# Patient Record
Sex: Female | Born: 1975
Health system: Southern US, Community
[De-identification: ages and names within clinical notes are randomized; demographics above are authoritative.]

## PROBLEM LIST (undated history)

## (undated) DIAGNOSIS — Z9889 Other specified postprocedural states: Secondary | ICD-10-CM

## (undated) DIAGNOSIS — U071 COVID-19: Secondary | ICD-10-CM

## (undated) DIAGNOSIS — J189 Pneumonia, unspecified organism: Secondary | ICD-10-CM

## (undated) DIAGNOSIS — G4730 Sleep apnea, unspecified: Secondary | ICD-10-CM

## (undated) DIAGNOSIS — J386 Stenosis of larynx: Secondary | ICD-10-CM

## (undated) DIAGNOSIS — F329 Major depressive disorder, single episode, unspecified: Secondary | ICD-10-CM

## (undated) DIAGNOSIS — R112 Nausea with vomiting, unspecified: Secondary | ICD-10-CM

## (undated) DIAGNOSIS — G839 Paralytic syndrome, unspecified: Secondary | ICD-10-CM

## (undated) DIAGNOSIS — D649 Anemia, unspecified: Secondary | ICD-10-CM

## (undated) DIAGNOSIS — J45909 Unspecified asthma, uncomplicated: Secondary | ICD-10-CM

## (undated) DIAGNOSIS — IMO0001 Reserved for inherently not codable concepts without codable children: Secondary | ICD-10-CM

## (undated) DIAGNOSIS — Z87442 Personal history of urinary calculi: Secondary | ICD-10-CM

## (undated) DIAGNOSIS — F32A Depression, unspecified: Secondary | ICD-10-CM

## (undated) DIAGNOSIS — F419 Anxiety disorder, unspecified: Secondary | ICD-10-CM

## (undated) DIAGNOSIS — K219 Gastro-esophageal reflux disease without esophagitis: Secondary | ICD-10-CM

## (undated) HISTORY — PX: WISDOM TOOTH EXTRACTION: SHX21

## (undated) HISTORY — PX: VARICOSE VEIN SURGERY: SHX832

---

## 1997-12-21 ENCOUNTER — Observation Stay (HOSPITAL_COMMUNITY): Admission: AD | Admit: 1997-12-21 | Discharge: 1997-12-22 | Payer: Self-pay | Admitting: *Deleted

## 1998-05-03 ENCOUNTER — Inpatient Hospital Stay (HOSPITAL_COMMUNITY): Admission: AD | Admit: 1998-05-03 | Discharge: 1998-05-05 | Payer: Self-pay | Admitting: *Deleted

## 1998-05-24 ENCOUNTER — Inpatient Hospital Stay (HOSPITAL_COMMUNITY): Admission: AD | Admit: 1998-05-24 | Discharge: 1998-05-24 | Payer: Self-pay | Admitting: *Deleted

## 1998-05-25 ENCOUNTER — Inpatient Hospital Stay (HOSPITAL_COMMUNITY): Admission: AD | Admit: 1998-05-25 | Discharge: 1998-05-27 | Payer: Self-pay | Admitting: Obstetrics and Gynecology

## 1999-08-15 ENCOUNTER — Other Ambulatory Visit: Admission: RE | Admit: 1999-08-15 | Discharge: 1999-08-15 | Payer: Self-pay | Admitting: Obstetrics and Gynecology

## 1999-11-21 ENCOUNTER — Emergency Department (HOSPITAL_COMMUNITY): Admission: EM | Admit: 1999-11-21 | Discharge: 1999-11-21 | Payer: Self-pay | Admitting: Emergency Medicine

## 2000-08-23 ENCOUNTER — Other Ambulatory Visit: Admission: RE | Admit: 2000-08-23 | Discharge: 2000-08-23 | Payer: Self-pay | Admitting: Obstetrics and Gynecology

## 2001-03-08 ENCOUNTER — Inpatient Hospital Stay (HOSPITAL_COMMUNITY): Admission: AD | Admit: 2001-03-08 | Discharge: 2001-03-11 | Payer: Self-pay | Admitting: Obstetrics and Gynecology

## 2001-03-28 ENCOUNTER — Inpatient Hospital Stay (HOSPITAL_COMMUNITY): Admission: AD | Admit: 2001-03-28 | Discharge: 2001-03-30 | Payer: Self-pay | Admitting: Obstetrics and Gynecology

## 2001-05-01 ENCOUNTER — Other Ambulatory Visit: Admission: RE | Admit: 2001-05-01 | Discharge: 2001-05-01 | Payer: Self-pay | Admitting: Obstetrics and Gynecology

## 2002-09-09 ENCOUNTER — Other Ambulatory Visit: Admission: RE | Admit: 2002-09-09 | Discharge: 2002-09-09 | Payer: Self-pay | Admitting: Obstetrics and Gynecology

## 2008-03-25 ENCOUNTER — Ambulatory Visit (HOSPITAL_COMMUNITY): Admission: RE | Admit: 2008-03-25 | Discharge: 2008-03-25 | Payer: Self-pay | Admitting: Family Medicine

## 2010-01-06 ENCOUNTER — Encounter: Admission: RE | Admit: 2010-01-06 | Discharge: 2010-01-06 | Payer: Self-pay | Admitting: Family Medicine

## 2011-03-24 NOTE — H&P (Signed)
[facility]  Patient:    Karen Wolfe, Karen Wolfe                         MRN: [id number] Attending:  Nena Jordan A. Cherly Hensen, M.D.                         History and Physical  2SCHEDULED ADMISSION DATE:     Mar 28, 2001.  CHIEF COMPLAINT:              Induction of labor.  HISTORY OF PRESENT ILLNESS:   This is a 35 year old, gravida 2, para 1-0-0-1, married white female, last menstrual period is June 16, 2000, [facility] of Mar 23, 2001 now at 40-5/[redacted] weeks gestation being admitted for induction of labor secondary to postdates. The patients prenatal course has been complicated by right pyelonephritis with resultant passage of a renal stone and for which the patient has been on suppressive antibiotic therapy. The patient has had irregular contractions. Her membranes are intact. Group B strep culture is positive. Urine culture from the pyelonephritis was positive for group B strep and colonization. Last examination in the office on Mar 27, 2001 revealed the cervix to be 3 cm, about 60%, -1 to 0 station. Prenatal care is at [facility].  LABORATORY DATA:              Blood type is O positive, antibody screen is negative, RPR is nonreactive, rubella is immune, hepatitis B surface antigen is negative, HIV test is nonreactive, GC and Chlamydia cultures were negative. Pap smear was normal. AFP3 test was normal.  Ultrasound first trimester on August 23, 2000 was consistent with the LMP. with a single intrauterine pregnancy at 9.1 weeks.  Group B strep culture is positive on February 20, 2001.  Anatomic fetal survey done on November 16, 2000 was normal at 22.1 weeks.  One-hour GTT test was normal.  Ultrasound on January 31, 2001 for history of oligohydramnios and intrauterine growth restriction previous pregnancy showed adequate interval growth for this baby with normal amniotic fluid index.  PAST MEDICAL HISTORY:  ALLERGIES:                    No known drug  allergies.  MEDICATIONS:                  Prenatal vitamins.  MEDICAL HISTORY:              Negative.  SURGICAL HISTORY:             Negative.  OBSTETRICAL HISTORY:          June of 1999, 6-pound 3-ounce baby at 38 weeks, 12 hour labor. That pregnancy was complicated by oligohydramnios and intrauterine growth restriction.  FAMILY HISTORY:               Mother hyperthyroidism, paternal grandfather hypertension.  SOCIAL HISTORY:               Married, nonsmoker. Works as an Designer, television/film set.  REVIEW OF SYSTEMS:            Negative except for as noted in history of present illness.  PHYSICAL EXAMINATION:  GENERAL:                      Well-developed, well-nourished gravid white female in no acute distress, vital signs afebrile. Blood pressure 116/62.  SKIN:  No lesions.  HEENT:                        Anicteric sclera, pink conjunctiva, oropharynx negative.  HEART:                        Regular rate and rhythm without murmur.  LUNGS:                        Clear to auscultation.  BACK:                         No CVA tenderness.  BREASTS:                      Soft, nontender, no palpable mass.  ABDOMEN:                      Gravid fundal height of 37 cm.  PELVIC:                       As per HPI.  EXTREMITIES:                  Trace edema.  IMPRESSION:                   1. Postdates intrauterine gestation at                                  40-5/7 weeks.                               2. Group B streptococcus culture positive.                               3. Suppressive antibiotics therapy for history                                  of pyelonephritis in the pregnancy.  PLAN:                         1. Admission.                               2. Penicillin prophylaxis.                               3. Pitocin induction.                               4. Amniotomy p.r.n.                               5. Epidural as needed.  The patient does desire  sterilization if cesarean section does occur and the baby is otherwise healthy. She will proceed with her tubal ligation at that time. If she is a vaginal delivery, she prepares to wait to the interval time of  six weeks or greater for the procedure to be done. DD:  03/27/01 TD:  03/27/01 Job: [id number]

## 2011-03-24 NOTE — Discharge Summary (Signed)
[facility]  Patient:    Karen Wolfe, Karen Wolfe                       MRN: [id number] Adm. Date:  [id number] Disc. Date: [id number] Attending:  Maxie Better                           Discharge Summary  ADMISSION DIAGNOSES:          1. Right pyelonephritis.                               2. Positive group B Streptococcus culture.                               3. Intrauterine gestation at 37-5/7 weeks.  DISCHARGE DIAGNOSES:          1. Group B Streptococcus.                               2. Right pyelonephritis.                               3. Probable right kidney stone, passed.                               4. Gastroenteritis.                               5. Intrauterine gestation at 38-2/7 weeks.  HISTORY OF PRESENT ILLNESS:   A 35 year old gravida 2, para 1-0-0-1 female admitted at 37-5/[redacted] weeks gestation for IV antibiotics secondary to right pyelonephritis with urine culture positive for group B Streptococcus.  The patient is also noted to have a positive group B Strep rectovaginal culture. Please see the admission history and physical.  HOSPITAL COURSE:              The patient was admitted.  She was started on cefotetan intravenously.  CBC with differential was obtained.  The CBC revealed a white count of 9.1, hemoglobin 10.7, hematocrit 31.5, platelet count 266,000.  The patient, during her hospitalization, complained of watery diarrhea and nausea and vomiting.  The back pain subsequently resolved. C. difficile toxin was sent, which was subsequently read to be negative.  The patient, on Mar 11, 2001, passed something with her urine. It looked like a stone.  She had a reactive nonstress test with some variables.  No contractions.  By hospital day #4, the patient was feeling much better.  The watery stool had decreased and she was deemed well to be discharged home.  DISPOSITION:                  Home.  CONDITION ON DISCHARGE:        Stable.  DISCHARGE INSTRUCTIONS:       Call for temperature greater than or equal to 100.4, recurrence of back pain, signs or symptoms of labor.  Strain the urine for stones.  DISCHARGE MEDICATIONS:        Keflex 500 mg 1 p.o. q.6h. to complete a total of 14-day course.  FOLLOW-UP:  Follow up in the office on Friday at [facility]. DD:  04/17/01 TD:  04/17/01 Job: [id number]

## 2011-06-01 ENCOUNTER — Other Ambulatory Visit: Payer: Self-pay | Admitting: Family Medicine

## 2011-06-01 ENCOUNTER — Ambulatory Visit
Admission: RE | Admit: 2011-06-01 | Discharge: 2011-06-01 | Disposition: A | Discharge status: Home / Self Care | Payer: 59 | Source: Ambulatory Visit | Attending: Family Medicine | Admitting: Family Medicine

## 2011-06-01 DIAGNOSIS — R0602 Shortness of breath: Secondary | ICD-10-CM

## 2011-06-19 ENCOUNTER — Ambulatory Visit (HOSPITAL_COMMUNITY)
Admission: RE | Admit: 2011-06-19 | Discharge: 2011-06-19 | Disposition: A | Discharge status: Home / Self Care | Payer: 59 | Source: Ambulatory Visit | Attending: Family Medicine | Admitting: Family Medicine

## 2011-06-19 DIAGNOSIS — R0602 Shortness of breath: Secondary | ICD-10-CM | POA: Insufficient documentation

## 2015-08-03 ENCOUNTER — Emergency Department (HOSPITAL_COMMUNITY)
Admission: EM | Admit: 2015-08-03 | Discharge: 2015-08-03 | Disposition: A | Discharge status: Home / Self Care | Payer: Managed Care, Other (non HMO) | Attending: Emergency Medicine | Admitting: Emergency Medicine

## 2015-08-03 ENCOUNTER — Emergency Department (HOSPITAL_COMMUNITY): Payer: Managed Care, Other (non HMO)

## 2015-08-03 ENCOUNTER — Encounter (HOSPITAL_COMMUNITY): Payer: Self-pay | Admitting: Emergency Medicine

## 2015-08-03 DIAGNOSIS — J45901 Unspecified asthma with (acute) exacerbation: Secondary | ICD-10-CM | POA: Diagnosis not present

## 2015-08-03 DIAGNOSIS — R061 Stridor: Secondary | ICD-10-CM | POA: Diagnosis not present

## 2015-08-03 DIAGNOSIS — R000 Tachycardia, unspecified: Secondary | ICD-10-CM | POA: Insufficient documentation

## 2015-08-03 DIAGNOSIS — Z87442 Personal history of urinary calculi: Secondary | ICD-10-CM | POA: Insufficient documentation

## 2015-08-03 DIAGNOSIS — K219 Gastro-esophageal reflux disease without esophagitis: Secondary | ICD-10-CM | POA: Insufficient documentation

## 2015-08-03 DIAGNOSIS — Z79899 Other long term (current) drug therapy: Secondary | ICD-10-CM | POA: Insufficient documentation

## 2015-08-03 DIAGNOSIS — R42 Dizziness and giddiness: Secondary | ICD-10-CM | POA: Insufficient documentation

## 2015-08-03 DIAGNOSIS — R0602 Shortness of breath: Secondary | ICD-10-CM | POA: Diagnosis present

## 2015-08-03 HISTORY — DX: Depression, unspecified: F32.A

## 2015-08-03 HISTORY — DX: Unspecified asthma, uncomplicated: J45.909

## 2015-08-03 HISTORY — DX: Major depressive disorder, single episode, unspecified: F32.9

## 2015-08-03 HISTORY — DX: Gastro-esophageal reflux disease without esophagitis: K21.9

## 2015-08-03 LAB — CBC WITH DIFFERENTIAL/PLATELET
BASOS PCT: 0 %
Basophils Absolute: 0 10*3/uL (ref 0.0–0.1)
EOS PCT: 1 %
Eosinophils Absolute: 0.1 10*3/uL (ref 0.0–0.7)
HEMATOCRIT: 36.2 % (ref 36.0–46.0)
Hemoglobin: 11.7 g/dL — ABNORMAL LOW (ref 12.0–15.0)
LYMPHS PCT: 31 %
Lymphs Abs: 2.6 10*3/uL (ref 0.7–4.0)
MCH: 28.9 pg (ref 26.0–34.0)
MCHC: 32.3 g/dL (ref 30.0–36.0)
MCV: 89.4 fL (ref 78.0–100.0)
MONO ABS: 0.6 10*3/uL (ref 0.1–1.0)
MONOS PCT: 6 %
NEUTROS ABS: 5.2 10*3/uL (ref 1.7–7.7)
Neutrophils Relative %: 62 %
PLATELETS: 310 10*3/uL (ref 150–400)
RBC: 4.05 MIL/uL (ref 3.87–5.11)
RDW: 13.5 % (ref 11.5–15.5)
WBC: 8.6 10*3/uL (ref 4.0–10.5)

## 2015-08-03 LAB — BASIC METABOLIC PANEL
ANION GAP: 8 (ref 5–15)
BUN: 7 mg/dL (ref 6–20)
CALCIUM: 8.9 mg/dL (ref 8.9–10.3)
CO2: 25 mmol/L (ref 22–32)
Chloride: 103 mmol/L (ref 101–111)
Creatinine, Ser: 0.94 mg/dL (ref 0.44–1.00)
GFR calc Af Amer: >60 mL/min (ref >60)
GFR calc non Af Amer: >60 mL/min (ref >60)
GLUCOSE: 147 mg/dL — AB (ref 65–99)
Potassium: 3.4 mmol/L — ABNORMAL LOW (ref 3.5–5.1)
Sodium: 136 mmol/L (ref 135–145)

## 2015-08-03 LAB — PREGNANCY, URINE: Preg Test, Ur: NEGATIVE

## 2015-08-03 MED ORDER — IPRATROPIUM-ALBUTEROL 0.5-2.5 (3) MG/3ML IN SOLN
3.0000 mL | Freq: Once | RESPIRATORY_TRACT | Status: AC
  Administered 2015-08-03: 3 mL via RESPIRATORY_TRACT
  Filled 2015-08-03: qty 3

## 2015-08-03 MED ORDER — DEXAMETHASONE SODIUM PHOSPHATE 10 MG/ML IJ SOLN
10.0000 mg | Freq: Once | INTRAMUSCULAR | Status: AC
  Administered 2015-08-03: 10 mg via INTRAVENOUS
  Filled 2015-08-03: qty 1

## 2015-08-03 MED ORDER — ALBUTEROL SULFATE (2.5 MG/3ML) 0.083% IN NEBU
INHALATION_SOLUTION | RESPIRATORY_TRACT | Status: AC
  Filled 2015-08-03: qty 6

## 2015-08-03 NOTE — ED Provider Notes (Signed)
CSN: [id number]     Arrival date & time 08/03/15  1312 History   First MD Initiated Contact with Patient 08/03/15 1339     Chief Complaint  Patient presents with  . Shortness of Breath     (Consider location/radiation/quality/duration/timing/severity/associated sxs/prior Treatment) HPI Comments: 39 year old female with a history of asthma, depression, GERD who presents with shortness of breath. The patient states that for the past several days she has had increasing shortness of breath. The shortness of breath feels like it's coming from her upper airway and she feels like she is breathing through a straw. Today, she ate a [place] sandwich and after lunch began feeling funny, stating that her upper airway stridor became worse and she felt dizzy. She has been evaluated by ENT in the past and was told that she has a narrowing of her upper airway that may require dilation in the future, liver she has been holding off for now. She has follow-up scheduled in a few weeks. She denies any associated chest pain, abdominal pain, vomiting, diarrhea, or cold symptoms. She has an occasional cough. No personal or family history of blood clots. No recent travel or OCP use. No leg swelling or pain. No family history of early heart disease.  Patient is a 39 y.o. female presenting with shortness of breath. The history is provided by the patient.  Shortness of Breath   Past Medical History  Diagnosis Date  . Asthma   . Kidney stone   . Kidney stone   . Depression   . GERD (gastroesophageal reflux disease)    Past Surgical History  Procedure Laterality Date  . Wisdom tooth extraction     History reviewed. No pertinent family history. Social History  Substance Use Topics  . Smoking status: Never Smoker   . Smokeless tobacco: None  . Alcohol Use: No   OB History    No data available     Review of Systems  Respiratory: Positive for shortness of breath.     10 Systems reviewed and are negative for  acute change except as noted in the HPI.   Allergies  Mold extract  Home Medications   Prior to Admission medications   Medication Sig Start Date End Date Taking? Authorizing Provider  buPROPion (WELLBUTRIN XL) 150 MG 24 hr tablet Take 150 mg by mouth daily. 07/13/15  Yes Historical Provider, MD  cetirizine (ZYRTEC) 10 MG tablet Take 10 mg by mouth daily as needed for allergies.   Yes Historical Provider, MD  loratadine (CLARITIN) 10 MG tablet Take 10 mg by mouth daily.   Yes Historical Provider, MD  omeprazole (PRILOSEC) 40 MG capsule Take 40 mg by mouth daily. 07/13/15  Yes Historical Provider, MD  sertraline (ZOLOFT) 50 MG tablet Take 50 mg by mouth daily. 07/13/15  Yes Historical Provider, MD  XOPENEX HFA 45 MCG/ACT inhaler Inhale 1 puff into the lungs every 4 (four) hours as needed for wheezing or shortness of breath.  06/01/15  Yes Historical Provider, MD   BP 122/69 mmHg  Pulse 113  Temp(Src) 98.8 F (37.1 C) (Oral)  Resp 22  SpO2 99%  LMP 07/31/2015 Physical Exam  Constitutional: She is oriented to person, place, and time. She appears well-developed and well-nourished.  Uncomfortable but NAD  HENT:  Head: Normocephalic and atraumatic.  Moist mucous membranes  Eyes: Conjunctivae are normal. Pupils are equal, round, and reactive to light.  Neck: Neck supple. No tracheal deviation present.  Soft inspiratory stridor  Cardiovascular: Regular rhythm  and normal heart sounds.   No murmur heard. tachycardic  Pulmonary/Chest:  Mildly increased WOB during inspiration w/ inspiratory stridor noted; faint wheezes in bases b/l  Abdominal: Soft. Bowel sounds are normal. She exhibits no distension. There is no tenderness.  Musculoskeletal: She exhibits no edema.  Neurological: She is alert and oriented to person, place, and time.  Fluent speech  Skin: Skin is warm and dry.  Psychiatric: She has a normal mood and affect. Judgment normal.  Nursing note and vitals reviewed.   ED Course   Procedures (including critical care time) Labs Review Labs Reviewed  BASIC METABOLIC PANEL - Abnormal; Notable for the following:    Potassium 3.4 (*)    Glucose, Bld 147 (*)    All other components within normal limits  CBC WITH DIFFERENTIAL/PLATELET - Abnormal; Notable for the following:    Hemoglobin 11.7 (*)    All other components within normal limits  PREGNANCY, URINE    Imaging Review Dg Chest 2 View  08/03/2015   CLINICAL DATA:  Progressive shortness of breath over past several days  EXAM: CHEST  2 VIEW  COMPARISON:  June 01, 2011  FINDINGS: Lungs are clear. The heart size and pulmonary vascularity are normal. No adenopathy. No bone lesions.  IMPRESSION: No edema or consolidation.   Electronically Signed   By: Bretta Bang III M.D.   On: 08/03/2015 15:03      EKG Interpretation   Date/Time:  Tuesday August 03 2015 13:49:50 EDT Ventricular Rate:  103 PR Interval:  150 QRS Duration: 85 QT Interval:  341 QTC Calculation: 446 R Axis:   56 Text Interpretation:  Sinus tachycardia Confirmed by LITTLE MD, RACHEL  [phone]) on 08/03/2015 2:06:35 PM     Medications  albuterol (PROVENTIL) (2.5 MG/3ML) 0.083% nebulizer solution (  Duplicate 08/03/15 1442)  dexamethasone (DECADRON) injection 10 mg (10 mg Intravenous Given 08/03/15 1407)  ipratropium-albuterol (DUONEB) 0.5-2.5 (3) MG/3ML nebulizer solution 3 mL (3 mLs Nebulization Given 08/03/15 1407)    MDM   Final diagnoses:  Inspiratory stridor   39 year old female with a history of stridor thought to be related to silent reflux who presents with several days of worsening shortness of breath and upper airway stridor. On arrival, the patient was uncomfortable with mildly increased work of breathing during inspiration and soft inspiratory stridor noted on exam. Faint wheezes bilaterally. She was mildly tachycardic but O2 sats were normal on room air. EKG showed sinus tachycardia without ischemic changes. Obtained chest  x-ray. Gave the patient a DuoNeb as well as 10 mg IV Decadron.  Patient has no risk factors for PE and describes her SOB as an upper airway tightness sensation, which is more c/w upper airway strenosis rather than PE. No risk factors for early heart disease. On reevaluation after receiving DuoNeb neb, the patient was breathing comfortably with O2 sat 99-100%. She remains tachycardic but states that her breathing treatment just finished 30-45 minutes ago and she feels that it is related to the albuterol. She states that all of her symptoms feel like they involve her upper airway and she feels comfortable without any further workup including PE workup at this time. I spoke with her ENT, Dr. Jenne Pane, who has recommended Medrol Dosepak and follow-up in the clinic tomorrow. I have relayed this information the patient and provided her with a prescription. Extensively reviewed return precautions including worsening symptoms and the patient voiced understanding. Patient in agreement with plan. Patient discharged in satisfactory condition.    Fleet Contras  Pecolia Ades, MD 08/03/15 [phone]

## 2015-08-03 NOTE — ED Notes (Signed)
Pt reports increasing SOB over the last few days, today at 1145 after eating [place] sandwich began to have increased SOB. Pt with upper airway stridors sounds. Pt seen by ENT in the past for bronch and told she has narrowing of airways that may require stretching. Pt awake, alert, VSS.

## 2015-08-03 NOTE — ED Notes (Signed)
Patient transported to X-ray 

## 2015-08-03 NOTE — ED Notes (Signed)
Dr. Little at bedside.  

## 2015-08-03 NOTE — Discharge Instructions (Signed)
Stridor °Stridor is an abnormal, usually high-pitched sound made while breathing. It is the result of an airway that is partly blocked. Stridor occurs more often in children than in adults because children have smaller airways. Many different things can cause stridor. It might be an infection, a tumor, something stuck in the breathing passage, or part of a developmental problem of the airways. It is important that the symptoms be checked out promptly, especially in young children. °CAUSES  °Stridor can develop from an acute problem and come on quickly in children. This is often because: °· Something gets stuck in the child's throat, nose or airways. The stuck item could be anything, but might be a piece of food or a coin. °· The child develops croup. This is a breathing problem with a cough that sounds like a dog's bark. It results from swelling around the vocal cords. Croup is usually caused by a virus. °· The child develops swollen tonsils or adenoids (tonsillitis). °· The child develops a swollen area filled with pus on the tonsils (abscess). °· The child has an allergic reaction. This could be to something that was breathed in, swallowed or injected. °· The child had their airway evaluated by instruments or had a tube in their airway. °· The child develops epiglottitis. This is an emergency condition. This occurs when the epiglottis (a small piece of tissue that covers the windpipe when you swallow and keeps food from going into the lungs) becomes inflamed (the body's way or reacting to injury or infection). Different things can cause the inflammation, including: °¨ Infection (this is the usual cause). °¨ Injury (swallowing chemicals, for example). °Stridor also can develop from a longtime (chronic) problem. Possibilities include: °· Laryngomalacia. This occurs when floppy tissue above the vocal cords collapses into the airway when the child breathes in. °· Subglottic stenosis. This is a narrowing of the airway  just below the vocal cords. °· Tracheomalacia. This occurs when the cartilage that keeps the airway open is weak. The cartilage is weak and floppy causing the airway to collapse in. This can also occur when there is something compressing the airway or something damages the cartilage causing it to become weak. °· Vocal cord paralysis. This may result from trauma or brain abnormality. For instance, the vocal cords might have been injured during earlier surgery. °· An injury to the voice box. °· A tumor. °DIAGNOSIS  °In an emergency:  °· If something is stuck in a child's airway, the Heimlich maneuver might be used to force the item out of the windpipe. °· If something is blocking the airway, an artificial airway may need to be placed for relief of the obstruction. °· An operation may needed. °If the child is not in immediate danger: °· The child will be given a thorough exam. Usually, the child's temperature, pulse, breathing rate and oxygen levels will be checked. The healthcare provider will listen to the child's lungs through a stethoscope. The child's throat will be checked. °· The healthcare provider will check for swelling in the child's neck or face area. °· The healthcare provider will ask about the child's medical history. This will include questions about the abnormal breathing sound. They may ask when the abnormal breathing started and what did it sound like. °· The healthcare provider may also order some tests. These could include: °¨ Blood tests. The blood can give clues to the child's overall health. It also can show signs of infection. And, a blood test can show how   much oxygen the child is getting. °¨ Pulse oximetry . A device is put on the child's fingertip to measure oxygen levels in the blood. °¨ Bronchoscopy . A flexible tube with a camera and a light is used to evaluate the airways. The child probably will be given medication to numb pain and help the child relax for the test. If given general  anesthesia, the child will be asleep for the procedure. A local anesthetic would numb the area of the body, but the child would be awake. A sedative will help the child relax. °¨ CT (computed tomography) scan. This scan provides a detailed picture inside the body. °¨ Laryngoscopy . A small, lighted tube is used to check the area around voice box. This is usually done without sedation while the patient is awake °¨ X-ray of the chest or neck. This can sometimes locate something stuck in the airway or show swelling in the airway. °TREATMENT  °In the short term: °· If something is stuck in a child's airway, the Heimlich maneuver might be used to force the item out of the windpipe. °· If nothing is stuck but the child has serious trouble breathing, an artificial airway or an operation to create an airway may be needed °In the longer term, stridor is treated by treating whatever is causing it:  °· If a growth or tumor is causing the obstruction, surgery may be recommended to remove it. °· Antibiotics may be prescribed to treat an infection. °HOME CARE INSTRUCTIONS  °What care the child will need at home will depend on what caused the stridor and how it was treated. In general: °· Ask the child's healthcare provider if there is anything the child should or should not do while recovering. °· Make sure the child takes any medications that were prescribed. Follow the directions carefully. The child should take all of the medicine, unless the healthcare provider has given different instructions. °· Encourage the child to eat slowly. Careful eating can help prevent food from being inhaled accidentally. °SEEK MEDICAL CARE IF:  °· The child develops a fever above 100.5° F (38.1° C). °SEEK IMMEDIATE MEDICAL CARE IF:  °· The child has trouble breathing again. °· Other symptoms return. °· The child develops a fever above 102.0° F (38.9° C). °Document Released: 08/20/2009 Document Revised: 01/15/2012 Document Reviewed:  08/20/2009 °Exit[employer]® Patient Information ©2015 [employer]. This information is not intended to replace advice given to you by your health care provider. Make sure you discuss any questions you have with your health care provider. ° °

## 2015-08-06 ENCOUNTER — Encounter (HOSPITAL_COMMUNITY): Payer: Self-pay | Admitting: *Deleted

## 2015-08-06 ENCOUNTER — Other Ambulatory Visit (HOSPITAL_COMMUNITY): Payer: Self-pay | Admitting: Otolaryngology

## 2015-08-09 ENCOUNTER — Encounter (HOSPITAL_COMMUNITY): Payer: Self-pay

## 2015-08-09 ENCOUNTER — Ambulatory Visit (HOSPITAL_COMMUNITY): Payer: Managed Care, Other (non HMO) | Admitting: Certified Registered"

## 2015-08-09 ENCOUNTER — Encounter (HOSPITAL_COMMUNITY)
Admission: RE | Disposition: A | Discharge status: Home / Self Care | Payer: Self-pay | Source: Ambulatory Visit | Attending: Otolaryngology

## 2015-08-09 ENCOUNTER — Ambulatory Visit (HOSPITAL_COMMUNITY)
Admission: RE | Admit: 2015-08-09 | Discharge: 2015-08-09 | Disposition: A | Discharge status: Home / Self Care | Payer: Managed Care, Other (non HMO) | Source: Ambulatory Visit | Attending: Otolaryngology | Admitting: Otolaryngology

## 2015-08-09 DIAGNOSIS — F329 Major depressive disorder, single episode, unspecified: Secondary | ICD-10-CM | POA: Diagnosis not present

## 2015-08-09 DIAGNOSIS — F419 Anxiety disorder, unspecified: Secondary | ICD-10-CM | POA: Diagnosis not present

## 2015-08-09 DIAGNOSIS — E669 Obesity, unspecified: Secondary | ICD-10-CM | POA: Diagnosis not present

## 2015-08-09 DIAGNOSIS — Z79899 Other long term (current) drug therapy: Secondary | ICD-10-CM | POA: Diagnosis not present

## 2015-08-09 DIAGNOSIS — K219 Gastro-esophageal reflux disease without esophagitis: Secondary | ICD-10-CM | POA: Diagnosis not present

## 2015-08-09 DIAGNOSIS — Z6831 Body mass index (BMI) 31.0-31.9, adult: Secondary | ICD-10-CM | POA: Insufficient documentation

## 2015-08-09 DIAGNOSIS — J386 Stenosis of larynx: Secondary | ICD-10-CM | POA: Diagnosis not present

## 2015-08-09 DIAGNOSIS — J45909 Unspecified asthma, uncomplicated: Secondary | ICD-10-CM | POA: Diagnosis not present

## 2015-08-09 HISTORY — DX: Anxiety disorder, unspecified: F41.9

## 2015-08-09 HISTORY — PX: MICROLARYNGOSCOPY WITH DILATION: SHX5971

## 2015-08-09 HISTORY — DX: Reserved for inherently not codable concepts without codable children: IMO0001

## 2015-08-09 HISTORY — DX: Personal history of urinary calculi: Z87.442

## 2015-08-09 SURGERY — MICROLARYNGOSCOPY WITH DILATION
Anesthesia: General | Site: Mouth

## 2015-08-09 MED ORDER — LACTATED RINGERS IV SOLN
INTRAVENOUS | Status: DC
  Administered 2015-08-09: 10:00:00 via INTRAVENOUS

## 2015-08-09 MED ORDER — SUCCINYLCHOLINE CHLORIDE 20 MG/ML IJ SOLN
INTRAMUSCULAR | Status: DC | PRN
  Administered 2015-08-09: 100 mg via INTRAVENOUS

## 2015-08-09 MED ORDER — FENTANYL CITRATE (PF) 100 MCG/2ML IJ SOLN
INTRAMUSCULAR | Status: AC
  Filled 2015-08-09: qty 2

## 2015-08-09 MED ORDER — EPINEPHRINE HCL (NASAL) 0.1 % NA SOLN
NASAL | Status: AC
  Filled 2015-08-09: qty 30

## 2015-08-09 MED ORDER — PROPOFOL 500 MG/50ML IV EMUL
INTRAVENOUS | Status: DC | PRN
  Administered 2015-08-09: 150 ug/kg/min via INTRAVENOUS

## 2015-08-09 MED ORDER — ONDANSETRON HCL 4 MG/2ML IJ SOLN
INTRAMUSCULAR | Status: AC
  Filled 2015-08-09: qty 2

## 2015-08-09 MED ORDER — MIDAZOLAM HCL 2 MG/2ML IJ SOLN
INTRAMUSCULAR | Status: AC
  Filled 2015-08-09: qty 4

## 2015-08-09 MED ORDER — PHENYLEPHRINE HCL 10 MG/ML IJ SOLN
INTRAMUSCULAR | Status: DC | PRN
  Administered 2015-08-09: 80 ug via INTRAVENOUS

## 2015-08-09 MED ORDER — 0.9 % SODIUM CHLORIDE (POUR BTL) OPTIME
TOPICAL | Status: DC | PRN
  Administered 2015-08-09: 1000 mL

## 2015-08-09 MED ORDER — PROMETHAZINE HCL 25 MG/ML IJ SOLN: 6.2500 mg | INTRAMUSCULAR | Status: DC | PRN

## 2015-08-09 MED ORDER — MIDAZOLAM HCL 5 MG/5ML IJ SOLN
INTRAMUSCULAR | Status: DC | PRN
  Administered 2015-08-09: 2 mg via INTRAVENOUS

## 2015-08-09 MED ORDER — OXYCODONE HCL 5 MG/5ML PO SOLN
ORAL | Status: AC
  Filled 2015-08-09: qty 5

## 2015-08-09 MED ORDER — PROPOFOL 10 MG/ML IV BOLUS
INTRAVENOUS | Status: AC
  Filled 2015-08-09: qty 20

## 2015-08-09 MED ORDER — SODIUM CHLORIDE 0.9 % IV SOLN
0.0500 ug/kg/min | INTRAVENOUS | Status: DC
  Filled 2015-08-09: qty 5000

## 2015-08-09 MED ORDER — FENTANYL CITRATE (PF) 250 MCG/5ML IJ SOLN
INTRAMUSCULAR | Status: AC
  Filled 2015-08-09: qty 5

## 2015-08-09 MED ORDER — SUCCINYLCHOLINE CHLORIDE 20 MG/ML IJ SOLN
INTRAMUSCULAR | Status: AC
  Filled 2015-08-09: qty 1

## 2015-08-09 MED ORDER — PROPOFOL 10 MG/ML IV BOLUS
INTRAVENOUS | Status: DC | PRN
  Administered 2015-08-09: 150 mg via INTRAVENOUS

## 2015-08-09 MED ORDER — OXYCODONE HCL 20 MG/ML PO CONC: 5.0000 mg | Freq: Once | ORAL | Status: DC

## 2015-08-09 MED ORDER — LACTATED RINGERS IV SOLN
INTRAVENOUS | Status: DC | PRN
  Administered 2015-08-09: 11:00:00 via INTRAVENOUS

## 2015-08-09 MED ORDER — REMIFENTANIL HCL 1 MG IV SOLR
INTRAVENOUS | Status: DC | PRN
  Administered 2015-08-09: .15 ug/kg/min via INTRAVENOUS

## 2015-08-09 MED ORDER — ROCURONIUM BROMIDE 50 MG/5ML IV SOLN
INTRAVENOUS | Status: AC
  Filled 2015-08-09: qty 1

## 2015-08-09 MED ORDER — NEOSTIGMINE METHYLSULFATE 10 MG/10ML IV SOLN
INTRAVENOUS | Status: AC
  Filled 2015-08-09: qty 1

## 2015-08-09 MED ORDER — FENTANYL CITRATE (PF) 100 MCG/2ML IJ SOLN
25.0000 ug | INTRAMUSCULAR | Status: DC | PRN
  Administered 2015-08-09: 50 ug via INTRAVENOUS

## 2015-08-09 MED ORDER — ALBUTEROL SULFATE HFA 108 (90 BASE) MCG/ACT IN AERS
INHALATION_SPRAY | RESPIRATORY_TRACT | Status: AC
  Filled 2015-08-09: qty 6.7

## 2015-08-09 MED ORDER — LIDOCAINE HCL (CARDIAC) 20 MG/ML IV SOLN
INTRAVENOUS | Status: AC
  Filled 2015-08-09: qty 5

## 2015-08-09 MED ORDER — SODIUM CHLORIDE 0.9 % IV SOLN
0.0125 ug/kg/min | INTRAVENOUS | Status: DC
  Filled 2015-08-09: qty 2000

## 2015-08-09 MED ORDER — SUGAMMADEX SODIUM 200 MG/2ML IV SOLN
INTRAVENOUS | Status: AC
  Filled 2015-08-09: qty 2

## 2015-08-09 MED ORDER — EPINEPHRINE HCL 1 MG/ML IJ SOLN
INTRAMUSCULAR | Status: DC | PRN
Start: 2015-08-09 — End: 2015-08-09
  Administered 2015-08-09: 30 mg via ENDOTRACHEOPULMONARY

## 2015-08-09 MED ORDER — OXYCODONE HCL 5 MG/5ML PO SOLN
5.0000 mg | Freq: Once | ORAL | Status: AC
  Administered 2015-08-09: 5 mg via ORAL

## 2015-08-09 SURGICAL SUPPLY — 36 items
BALLN PULM 15 16.5 18X75 (BALLOONS) ×2
BALLOON PULM 15 16.5 18X75 (BALLOONS) IMPLANT
BANDAGE EYE OVAL (MISCELLANEOUS) ×4 IMPLANT
BLADE SURG 15 STRL LF DISP TIS (BLADE) IMPLANT
BLADE SURG 15 STRL SS (BLADE)
CANISTER SUCTION 2500CC (MISCELLANEOUS) ×2 IMPLANT
CONT SPEC 4OZ CLIKSEAL STRL BL (MISCELLANEOUS) IMPLANT
COVER MAYO STAND STRL (DRAPES) ×1 IMPLANT
COVER TABLE BACK 60X90 (DRAPES) ×2 IMPLANT
CRADLE DONUT ADULT HEAD (MISCELLANEOUS) IMPLANT
DRAPE PROXIMA HALF (DRAPES) ×2 IMPLANT
GAUZE SPONGE 4X4 12PLY STRL (GAUZE/BANDAGES/DRESSINGS) ×2 IMPLANT
GAUZE SPONGE 4X4 16PLY XRAY LF (GAUZE/BANDAGES/DRESSINGS) ×1 IMPLANT
GLOVE BIO SURGEON STRL SZ7.5 (GLOVE) ×2 IMPLANT
GLOVE BIOGEL PI IND STRL 7.0 (GLOVE) IMPLANT
GLOVE BIOGEL PI INDICATOR 7.0 (GLOVE) ×2
GLOVE ECLIPSE 6.5 STRL STRAW (GLOVE) ×1 IMPLANT
GLOVE SURG SS PI 7.0 STRL IVOR (GLOVE) ×1 IMPLANT
GOWN STRL REUS W/ TWL LRG LVL3 (GOWN DISPOSABLE) IMPLANT
GOWN STRL REUS W/TWL LRG LVL3 (GOWN DISPOSABLE)
KIT BASIN OR (CUSTOM PROCEDURE TRAY) ×2 IMPLANT
KIT ROOM TURNOVER OR (KITS) ×2 IMPLANT
NDL HYPO 25GX1X1/2 BEV (NEEDLE) IMPLANT
NEEDLE HYPO 25GX1X1/2 BEV (NEEDLE) IMPLANT
NS IRRIG 1000ML POUR BTL (IV SOLUTION) ×2 IMPLANT
PAD ARMBOARD 7.5X6 YLW CONV (MISCELLANEOUS) ×4 IMPLANT
PATTIES SURGICAL .5 X3 (DISPOSABLE) ×2 IMPLANT
SOLUTION ANTI FOG 6CC (MISCELLANEOUS) IMPLANT
SURGILUBE 2OZ TUBE FLIPTOP (MISCELLANEOUS) IMPLANT
SYR INFLATE BILIARY GAUGE (MISCELLANEOUS) ×1 IMPLANT
SYSTEM BALLN DILATION 12X40 (BALLOONS) IMPLANT
SYSTEM BALLN DILATION 14X40 (BALLOONS) IMPLANT
SYSTEM BALLN DILATION 16X40 (BALLOONS) IMPLANT
TOWEL OR 17X24 6PK STRL BLUE (TOWEL DISPOSABLE) ×4 IMPLANT
TUBE CONNECTING 12X1/4 (SUCTIONS) ×2 IMPLANT
WATER STERILE IRR 1000ML POUR (IV SOLUTION) ×2 IMPLANT

## 2015-08-09 NOTE — Anesthesia Postprocedure Evaluation (Signed)
  Anesthesia Post-op Note  Patient: Karen Wolfe  Procedure(s) Performed: Procedure(s) (LRB): MICROLARYNGOSCOPY WITH DILATION (N/A)  Patient Location: PACU  Anesthesia Type: General  Level of Consciousness: awake and alert   Airway and Oxygen Therapy: Patient Spontanous Breathing  Post-op Pain: mild  Post-op Assessment: Post-op Vital signs reviewed, Patient's Cardiovascular Status Stable, Respiratory Function Stable, Patent Airway and No signs of Nausea or vomiting  Last Vitals:  Filed Vitals:   08/09/15 1216  BP: 122/96  Pulse: 89  Temp:   Resp: 22    Post-op Vital Signs: stable   Complications: No apparent anesthesia complications

## 2015-08-09 NOTE — Brief Op Note (Signed)
08/09/2015  11:54 AM  PATIENT:  Karen Wolfe  39 y.o. female  PRE-OPERATIVE DIAGNOSIS:  SUBGLOTTIC STENOSIS  POST-OPERATIVE DIAGNOSIS:  SUBGLOTTIC STENOSIS  PROCEDURE:  Procedure(s) with comments: MICROLARYNGOSCOPY WITH DILATION (N/A) - MICRO DIRECT LARYNGOSCOPY WITH CO2 LASER DILATION/JET VENTILATION  SURGEON:  Surgeon(s) and Role:    * Christia Reading, MD - Primary  PHYSICIAN ASSISTANT:   ASSISTANTS: none   ANESTHESIA:   general  EBL:     BLOOD ADMINISTERED:none  DRAINS: none   LOCAL MEDICATIONS USED:  NONE  SPECIMEN:  No Specimen  DISPOSITION OF SPECIMEN:  N/A  COUNTS:  YES  TOURNIQUET:  * No tourniquets in log *  DICTATION: .Other Dictation: Dictation Number [id number]  PLAN OF CARE: Discharge to home after PACU  PATIENT DISPOSITION:  PACU - hemodynamically stable.   Delay start of Pharmacological VTE agent (>24hrs) due to surgical blood loss or risk of bleeding: no

## 2015-08-09 NOTE — H&P (Signed)
Karen Wolfe is an 39 y.o. female.   Chief Complaint: subglottic stenosis HPI: 39 year old with about six months of a feeling of difficulty breathing, particularly with exertion.  She has soft inspiratory stridor.  Fiberoptic exam revealed subglottic stenosis in July.  She has been treated with PPI since then but symptoms have worsened gradually.  She feels limited in her activity and uncomfortable.  Past Medical History  Diagnosis Date  . Asthma   . Depression   . History of kidney stones   . Shortness of breath dyspnea     with exertion- upper airway stricture  . Anxiety   . GERD (gastroesophageal reflux disease)     ? silent reflux  . Vaginal delivery     epidural x2     Past Surgical History  Procedure Laterality Date  . Wisdom tooth extraction      History reviewed. No pertinent family history. Social History:  reports that she has never smoked. She does not have any smokeless tobacco history on file. She reports that she does not drink alcohol or use illicit drugs.  Allergies:  Allergies  Allergen Reactions  . Other Shortness Of Breath and Nausea Only    Difficukty Breathing  . Mold Extract [Trichophyton]   . Shellfish Allergy Nausea And Vomiting    Medications Prior to Admission  Medication Sig Dispense Refill  . buPROPion (WELLBUTRIN XL) 150 MG 24 hr tablet Take 150 mg by mouth daily.    . cetirizine (ZYRTEC) 10 MG tablet Take 10 mg by mouth daily as needed for allergies.    Marland Kitchen loratadine (CLARITIN) 10 MG tablet Take 10 mg by mouth daily.    Marland Kitchen omeprazole (PRILOSEC) 40 MG capsule Take 40 mg by mouth daily.    . sertraline (ZOLOFT) 50 MG tablet Take 50 mg by mouth daily.    Pauline Aus HFA 45 MCG/ACT inhaler Inhale 1 puff into the lungs every 4 (four) hours as needed for wheezing or shortness of breath.   3    No results found for this or any previous visit (from the past 48 hour(s)). No results found.  Review of Systems  All other systems reviewed and are  negative.   Blood pressure 165/78, pulse 88, temperature 97.6 F (36.4 C), temperature source Oral, resp. rate 18, height  (1.6 m), weight 81.647 kg (180 lb), last menstrual period 07/31/2015, SpO2 100 %. Physical Exam  Constitutional: She is oriented to person, place, and time. She appears well-developed and well-nourished. No distress.  HENT:  Head: Normocephalic and atraumatic.  Right Ear: External ear normal.  Left Ear: External ear normal.  Nose: Nose normal.  Mouth/Throat: Oropharynx is clear and moist.  Soft inspiratory stridor with deep inspiration.  Eyes: Conjunctivae and EOM are normal. Pupils are equal, round, and reactive to light.  Neck: Normal range of motion. Neck supple.  Cardiovascular: Normal rate.   Respiratory: Effort normal.  Musculoskeletal: Normal range of motion.  Neurological: She is alert and oriented to person, place, and time. No cranial nerve deficit.  Skin: Skin is warm and dry.  Psychiatric: She has a normal mood and affect. Her behavior is normal. Judgment and thought content normal.     Assessment/Plan Subglottic stenosis To OR for SMDL with CO2 laser dilation.  Neelam Tiggs 08/09/2015, 10:59 AM

## 2015-08-09 NOTE — Transfer of Care (Signed)
Immediate Anesthesia Transfer of Care Note  Patient: Karen Wolfe  Procedure(s) Performed: Procedure(s) with comments: MICROLARYNGOSCOPY WITH DILATION (N/A) - MICRO DIRECT LARYNGOSCOPY WITH CO2 LASER DILATION/JET VENTILATION  Patient Location: PACU  Anesthesia Type:TIVA   Level of Consciousness: awake, alert  and oriented  Airway & Oxygen Therapy: Patient Spontanous Breathing and Patient connected to face mask oxygen  Post-op Assessment: Report given to RN, Post -op Vital signs reviewed and stable and Patient moving all extremities  Post vital signs: Reviewed and stable  Last Vitals:  Filed Vitals:   08/09/15 1211  BP:   Pulse:   Temp: 36.7 C  Resp:     Complications: No apparent anesthesia complications

## 2015-08-09 NOTE — Anesthesia Preprocedure Evaluation (Addendum)
Anesthesia Evaluation  Patient identified by MRN, date of birth, ID band Patient awake    Reviewed: Allergy & Precautions, H&P , NPO status , Patient's Chart, lab work & pertinent test results  History of Anesthesia Complications Negative for: history of anesthetic complications  Airway Mallampati: I  TM Distance: >3 FB Neck ROM: full    Dental no notable dental hx.    Pulmonary shortness of breath, asthma ,    Pulmonary exam normal breath sounds clear to auscultation       Cardiovascular negative cardio ROS Normal cardiovascular exam Rhythm:regular Rate:Normal     Neuro/Psych Anxiety Depression negative neurological ROS     GI/Hepatic Neg liver ROS, GERD  Medicated,  Endo/Other  negative endocrine ROS  Renal/GU negative Renal ROS     Musculoskeletal   Abdominal (+) + obese,   Peds  Hematology negative hematology ROS (+)   Anesthesia Other Findings History of Tracheal Stenosis  Reproductive/Obstetrics negative OB ROS                            Anesthesia Physical Anesthesia Plan  ASA: II  Anesthesia Plan: General   Post-op Pain Management:    Induction: Intravenous  Airway Management Planned: Natural Airway  Additional Equipment:   Intra-op Plan:   Post-operative Plan: Extubation in OR  Informed Consent: I have reviewed the patients History and Physical, chart, labs and discussed the procedure including the risks, benefits and alternatives for the proposed anesthesia with the patient or authorized representative who has indicated his/her understanding and acceptance.   Dental Advisory Given  Plan Discussed with: Anesthesiologist, CRNA and Surgeon  Anesthesia Plan Comments: (Will discuss airway plan with ENT Dr. Jenne Pane  Likely propofol infusion with jet ventilation)      Anesthesia Quick Evaluation

## 2015-08-09 NOTE — Op Note (Signed)
NAMESHYLOH, DEROSA NO.:  [id number]  MEDICAL RECORD NO.:  [id number]  LOCATION:  [facility]                         FACILITY:  [facility]  PHYSICIAN:  Antony Contras, MD     DATE OF BIRTH:  [birth year]  DATE OF PROCEDURE:  08/09/2015 DATE OF DISCHARGE:  08/09/2015                              OPERATIVE REPORT   PREOPERATIVE DIAGNOSIS:  Subglottic stenosis.  POSTOPERATIVE DIAGNOSIS:  Subglottic stenosis.  PROCEDURE:  Suspended Micro-Direct laryngoscopy with CO2 laser dilation.  SURGEON:  Antony Contras, MD  ANESTHESIA:  General jet ventilation anesthesia.  COMPLICATIONS:  None.  INDICATION:  The patient is a 39 year old female who has noticed difficulty breathing for the past 6 months, it has worsened.  She was found to have subglottic stenosis by fiberoptic exam and has been under treatment with proton pump inhibitor, but symptoms have worsened.  Thus, she presents to the operating room for surgical management.  FINDINGS:  She was found to have a circumferential stenosis in the subglottic region, approximately 50%.  DESCRIPTION OF PROCEDURE:  The patient was identified in the holding room, informed consent having been obtained including discussion of risks, benefits, alternatives, the patient was brought to the operative suite and put on the table in supine position.  Anesthesia was induced, and the patient was maintained via mask ventilation.  The eyes were taped and closed and bed was turned to 90 degrees from anesthesia.  A tooth guard was placed over the upper teeth and damp eye pads were taped over the eyes.  A Storz laryngoscope was then placed in the glottic position to view the subglottis and was then suspended to the Mayo stand using a Lewy arm.  Jet Venturi ventilation was then initiated.  A damp towel was placed over the face.  A 0-degree telescope was then used to make a preoperative photograph.  Epinephrine-soaked pledgets were held against the  stenotic area for a brief time.  The CO2 laser was then used on a setting of 8 watts to make radial cuts at 7 o'clock, 5 o'clock, and 12 o'clock.  Epinephrine pledgets were again placed against the areas to help control bleeding.  After this, the largest tracheal balloon was then placed into the subglottic area and inflated to 7 atmospheres for 60 seconds.  The balloon was then removed, and the patient jet ventilated.  The balloon was again dilated in the same place for 60 seconds and then removed again.  The patient returned to jet ventilation.  Photographs were then made with a 0-degree telescope.  The laryngoscope was then taken out of suspension and removed from the patient's mouth while suctioning the airway and after for spraying the larynx with topical lidocaine using an LTA. The tooth guard was removed, and the patient was then returned to mask ventilation.  She was returned to anesthesia for wake-up, was moved to recovery room in stable condition.     Antony Contras, MD     DDB/MEDQ  D:  08/09/2015  T:  08/09/2015  Job:  [id number]

## 2015-08-10 ENCOUNTER — Encounter (HOSPITAL_COMMUNITY): Payer: Self-pay | Admitting: Otolaryngology

## 2016-11-09 ENCOUNTER — Other Ambulatory Visit: Payer: Self-pay | Admitting: Obstetrics and Gynecology

## 2016-11-14 ENCOUNTER — Encounter (HOSPITAL_COMMUNITY): Payer: Self-pay

## 2016-11-22 ENCOUNTER — Ambulatory Visit (HOSPITAL_COMMUNITY): Payer: BLUE CROSS/BLUE SHIELD | Admitting: Certified Registered Nurse Anesthetist

## 2016-11-22 ENCOUNTER — Encounter (HOSPITAL_COMMUNITY)
Admission: RE | Disposition: A | Discharge status: Home / Self Care | Payer: Self-pay | Source: Ambulatory Visit | Attending: Obstetrics and Gynecology

## 2016-11-22 ENCOUNTER — Ambulatory Visit (HOSPITAL_COMMUNITY)
Admission: RE | Admit: 2016-11-22 | Discharge: 2016-11-22 | Disposition: A | Discharge status: Home / Self Care | Payer: BLUE CROSS/BLUE SHIELD | Source: Ambulatory Visit | Attending: Obstetrics and Gynecology | Admitting: Obstetrics and Gynecology

## 2016-11-22 ENCOUNTER — Encounter (HOSPITAL_COMMUNITY): Payer: Self-pay

## 2016-11-22 DIAGNOSIS — N920 Excessive and frequent menstruation with regular cycle: Secondary | ICD-10-CM | POA: Diagnosis not present

## 2016-11-22 DIAGNOSIS — Z791 Long term (current) use of non-steroidal anti-inflammatories (NSAID): Secondary | ICD-10-CM | POA: Insufficient documentation

## 2016-11-22 DIAGNOSIS — F419 Anxiety disorder, unspecified: Secondary | ICD-10-CM | POA: Diagnosis not present

## 2016-11-22 DIAGNOSIS — K219 Gastro-esophageal reflux disease without esophagitis: Secondary | ICD-10-CM | POA: Insufficient documentation

## 2016-11-22 DIAGNOSIS — Z79899 Other long term (current) drug therapy: Secondary | ICD-10-CM | POA: Diagnosis not present

## 2016-11-22 DIAGNOSIS — F329 Major depressive disorder, single episode, unspecified: Secondary | ICD-10-CM | POA: Diagnosis not present

## 2016-11-22 DIAGNOSIS — N840 Polyp of corpus uteri: Secondary | ICD-10-CM | POA: Diagnosis not present

## 2016-11-22 HISTORY — DX: Other specified postprocedural states: Z98.890

## 2016-11-22 HISTORY — PX: DILATATION & CURETTAGE/HYSTEROSCOPY WITH MYOSURE: SHX6511

## 2016-11-22 HISTORY — DX: Nausea with vomiting, unspecified: R11.2

## 2016-11-22 LAB — CBC
HEMATOCRIT: 36.7 % (ref 36.0–46.0)
HEMOGLOBIN: 12.1 g/dL (ref 12.0–15.0)
MCH: 28.1 pg (ref 26.0–34.0)
MCHC: 33 g/dL (ref 30.0–36.0)
MCV: 85.2 fL (ref 78.0–100.0)
Platelets: 363 10*3/uL (ref 150–400)
RBC: 4.31 MIL/uL (ref 3.87–5.11)
RDW: 13.9 % (ref 11.5–15.5)
WBC: 6.4 10*3/uL (ref 4.0–10.5)

## 2016-11-22 SURGERY — DILATATION & CURETTAGE/HYSTEROSCOPY WITH MYOSURE
Anesthesia: General | Site: Vagina

## 2016-11-22 MED ORDER — ONDANSETRON HCL 4 MG/2ML IJ SOLN
INTRAMUSCULAR | Status: AC
  Filled 2016-11-22: qty 2

## 2016-11-22 MED ORDER — KETOROLAC TROMETHAMINE 30 MG/ML IJ SOLN
INTRAMUSCULAR | Status: AC
  Filled 2016-11-22: qty 1

## 2016-11-22 MED ORDER — LIDOCAINE HCL 1 % IJ SOLN
INTRAMUSCULAR | Status: AC
  Filled 2016-11-22: qty 20

## 2016-11-22 MED ORDER — SODIUM CHLORIDE 0.9 % IR SOLN
Status: DC | PRN
  Administered 2016-11-22: 3000 mL

## 2016-11-22 MED ORDER — MIDAZOLAM HCL 2 MG/2ML IJ SOLN
INTRAMUSCULAR | Status: DC | PRN
  Administered 2016-11-22: 2 mg via INTRAVENOUS

## 2016-11-22 MED ORDER — PROPOFOL 10 MG/ML IV BOLUS
INTRAVENOUS | Status: AC
  Filled 2016-11-22: qty 20

## 2016-11-22 MED ORDER — PROMETHAZINE HCL 25 MG/ML IJ SOLN: 6.2500 mg | INTRAMUSCULAR | Status: DC | PRN

## 2016-11-22 MED ORDER — FENTANYL CITRATE (PF) 100 MCG/2ML IJ SOLN
INTRAMUSCULAR | Status: DC | PRN
  Administered 2016-11-22: 25 ug via INTRAVENOUS
  Administered 2016-11-22: 50 ug via INTRAVENOUS

## 2016-11-22 MED ORDER — LIDOCAINE HCL (CARDIAC) 20 MG/ML IV SOLN
INTRAVENOUS | Status: DC | PRN
  Administered 2016-11-22: 50 mg via INTRAVENOUS

## 2016-11-22 MED ORDER — MIDAZOLAM HCL 2 MG/2ML IJ SOLN
INTRAMUSCULAR | Status: AC
  Filled 2016-11-22: qty 2

## 2016-11-22 MED ORDER — SCOPOLAMINE 1 MG/3DAYS TD PT72
MEDICATED_PATCH | TRANSDERMAL | Status: AC
  Administered 2016-11-22: 1.5 mg via TRANSDERMAL
  Filled 2016-11-22: qty 1

## 2016-11-22 MED ORDER — KETOROLAC TROMETHAMINE 30 MG/ML IJ SOLN
INTRAMUSCULAR | Status: DC | PRN
  Administered 2016-11-22: 30 mg via INTRAVENOUS
  Administered 2016-11-22: 30 mg via INTRAMUSCULAR

## 2016-11-22 MED ORDER — SCOPOLAMINE 1 MG/3DAYS TD PT72
1.0000 | MEDICATED_PATCH | Freq: Once | TRANSDERMAL | Status: DC
  Administered 2016-11-22: 1.5 mg via TRANSDERMAL

## 2016-11-22 MED ORDER — MEPERIDINE HCL 25 MG/ML IJ SOLN: 6.2500 mg | INTRAMUSCULAR | Status: DC | PRN

## 2016-11-22 MED ORDER — IBUPROFEN 800 MG PO TABS: 400.0000 mg | ORAL_TABLET | Freq: Four times a day (QID) | ORAL | 1 refills | Status: DC | PRN

## 2016-11-22 MED ORDER — DEXAMETHASONE SODIUM PHOSPHATE 4 MG/ML IJ SOLN
INTRAMUSCULAR | Status: AC
  Filled 2016-11-22: qty 1

## 2016-11-22 MED ORDER — FENTANYL CITRATE (PF) 100 MCG/2ML IJ SOLN
INTRAMUSCULAR | Status: AC
  Filled 2016-11-22: qty 2

## 2016-11-22 MED ORDER — LACTATED RINGERS IV SOLN
INTRAVENOUS | Status: DC
  Administered 2016-11-22: 125 mL/h via INTRAVENOUS

## 2016-11-22 MED ORDER — DEXAMETHASONE SODIUM PHOSPHATE 4 MG/ML IJ SOLN
INTRAMUSCULAR | Status: DC | PRN
  Administered 2016-11-22: 4 mg via INTRAVENOUS

## 2016-11-22 MED ORDER — MIDAZOLAM HCL 2 MG/2ML IJ SOLN: 0.5000 mg | Freq: Once | INTRAMUSCULAR | Status: DC | PRN

## 2016-11-22 MED ORDER — ONDANSETRON HCL 4 MG/2ML IJ SOLN
INTRAMUSCULAR | Status: DC | PRN
  Administered 2016-11-22: 4 mg via INTRAVENOUS

## 2016-11-22 MED ORDER — FENTANYL CITRATE (PF) 100 MCG/2ML IJ SOLN: 25.0000 ug | INTRAMUSCULAR | Status: DC | PRN

## 2016-11-22 SURGICAL SUPPLY — 20 items
CANISTER SUCT 3000ML (MISCELLANEOUS) ×2 IMPLANT
CATH ROBINSON RED A/P 16FR (CATHETERS) ×2 IMPLANT
CLOTH BEACON ORANGE TIMEOUT ST (SAFETY) ×2 IMPLANT
CONTAINER PREFILL 10% NBF 60ML (FORM) ×4 IMPLANT
DEVICE MYOSURE LITE (MISCELLANEOUS) ×1 IMPLANT
DEVICE MYOSURE REACH (MISCELLANEOUS) IMPLANT
ELECT REM PT RETURN 9FT ADLT (ELECTROSURGICAL)
ELECTRODE REM PT RTRN 9FT ADLT (ELECTROSURGICAL) ×1 IMPLANT
FILTER ARTHROSCOPY CONVERTOR (FILTER) ×2 IMPLANT
GLOVE BIOGEL PI IND STRL 7.0 (GLOVE) ×2 IMPLANT
GLOVE BIOGEL PI INDICATOR 7.0 (GLOVE) ×2
GLOVE ECLIPSE 6.5 STRL STRAW (GLOVE) ×2 IMPLANT
GOWN STRL REUS W/TWL LRG LVL3 (GOWN DISPOSABLE) ×4 IMPLANT
PACK VAGINAL MINOR WOMEN LF (CUSTOM PROCEDURE TRAY) ×2 IMPLANT
PAD OB MATERNITY 4.3X12.25 (PERSONAL CARE ITEMS) ×2 IMPLANT
SEAL ROD LENS SCOPE MYOSURE (ABLATOR) ×2 IMPLANT
TOWEL OR 17X24 6PK STRL BLUE (TOWEL DISPOSABLE) ×4 IMPLANT
TUBING AQUILEX INFLOW (TUBING) ×2 IMPLANT
TUBING AQUILEX OUTFLOW (TUBING) ×2 IMPLANT
WATER STERILE IRR 1000ML POUR (IV SOLUTION) ×1 IMPLANT

## 2016-11-22 NOTE — Anesthesia Procedure Notes (Signed)
Procedure Name: LMA Insertion Date/Time: 11/22/2016 8:30 AM Performed by: Cleda ClarksBROWDER, Constantinos Krempasky R Pre-anesthesia Checklist: Patient identified, Emergency Drugs available, Suction available and Patient being monitored Patient Re-evaluated:Patient Re-evaluated prior to inductionOxygen Delivery Method: Circle system utilized Preoxygenation: Pre-oxygenation with 100% oxygen Intubation Type: IV induction Ventilation: Mask ventilation without difficulty LMA: LMA inserted LMA Size: 4.0 Number of attempts: 1 Airway Equipment and Method: Bite block Placement Confirmation: positive ETCO2 Tube secured with: Tape Dental Injury: Teeth and Oropharynx as per pre-operative assessment

## 2016-11-22 NOTE — Transfer of Care (Signed)
Immediate Anesthesia Transfer of Care Note  Patient: Karen Wolfe  Procedure(s) Performed: Procedure(s): DILATATION & CURETTAGE/HYSTEROSCOPY WITH MYOSURE (N/A)  Patient Location: PACU  Anesthesia Type:General  Level of Consciousness: awake, alert  and oriented  Airway & Oxygen Therapy: Patient Spontanous Breathing and Patient connected to nasal cannula oxygen  Post-op Assessment: Report given to RN and Post -op Vital signs reviewed and stable  Post vital signs: Reviewed and stable  Last Vitals:  Vitals:   11/22/16 0739  BP: 131/81  Pulse: (!) 104  Resp: 16  Temp: 37.4 C    Last Pain:  Vitals:   11/22/16 0739  TempSrc: Oral      Patients Stated Pain Goal: 5 (11/22/16 0739)  Complications: No apparent anesthesia complications

## 2016-11-22 NOTE — Anesthesia Preprocedure Evaluation (Addendum)
Anesthesia Evaluation  Patient identified by MRN, date of birth, ID band Patient awake    Reviewed: Allergy & Precautions, NPO status , Patient's Chart, lab work & pertinent test results  History of Anesthesia Complications (+) PONV  Airway Mallampati: II  TM Distance: >3 FB Neck ROM: Full    Dental  (+) Dental Advisory Given   Pulmonary asthma (no inhaler needed in over a week) ,    breath sounds clear to auscultation       Cardiovascular negative cardio ROS   Rhythm:Regular Rate:Normal     Neuro/Psych negative neurological ROS     GI/Hepatic Neg liver ROS, GERD  Medicated and Controlled,  Endo/Other  Morbid obesity  Renal/GU negative Renal ROS     Musculoskeletal negative musculoskeletal ROS (+)   Abdominal (+) + obese,   Peds  Hematology negative hematology ROS (+)   Anesthesia Other Findings   Reproductive/Obstetrics LMP 11/08/16                            Anesthesia Physical Anesthesia Plan  ASA: II  Anesthesia Plan: General   Post-op Pain Management:    Induction:   Airway Management Planned: LMA  Additional Equipment:   Intra-op Plan:   Post-operative Plan:   Informed Consent: I have reviewed the patients History and Physical, chart, labs and discussed the procedure including the risks, benefits and alternatives for the proposed anesthesia with the patient or authorized representative who has indicated his/her understanding and acceptance.   Dental advisory given  Plan Discussed with: CRNA and Surgeon  Anesthesia Plan Comments: (Plan routine monitors, GA- LMA OK)        Anesthesia Quick Evaluation

## 2016-11-22 NOTE — Discharge Instructions (Signed)
CALL  IF TEMP>100.4, NOTHING PER VAGINA X 1WK, CALL IF SOAKING A MAXI  PAD EVERY HOUR OR MORE FREQUENTLY  DISCHARGE INSTRUCTIONS: HYSTEROSCOPY  The following instructions have been prepared to help you care for yourself upon your return home.  May Remove Scop patch on or before  May take Ibuprofen after  May take stool softner while taking narcotic pain medication to prevent constipation.  Drink plenty of water.  Personal hygiene:  Use sanitary pads for vaginal drainage, not tampons.  Shower the day after your procedure.  NO tub baths, pools or Jacuzzis for 2-3 weeks.  Wipe front to back after using the bathroom.  Activity and limitations:  Do NOT drive or operate any equipment for 24 hours. The effects of anesthesia are still present and drowsiness may result.  Do NOT rest in bed all day.  Walking is encouraged.  Walk up and down stairs slowly.  You may resume your normal activity in one to two days or as indicated by your physician. Sexual activity: NO intercourse for at least 2 weeks after the procedure, or as indicated by your Doctor.  Diet: Eat a light meal as desired this evening. You may resume your usual diet tomorrow.  Return to Work: You may resume your work activities in one to two days or as indicated by Therapist, sportsyour Doctor.  What to expect after your surgery: Expect to have vaginal bleeding/discharge for 2-3 days and spotting for up to 10 days. It is not unusual to have soreness for up to 1-2 weeks. You may have a slight burning sensation when you urinate for the first day. Mild cramps may continue for a couple of days. You may have a regular period in 2-6 weeks.  Call your doctor for any of the following:  Excessive vaginal bleeding or clotting, saturating and changing one pad every hour.  Inability to urinate 6 hours after discharge from hospital.  Pain not relieved by pain medication.  Fever of 100.4 F or greater.  Unusual vaginal discharge or  odor.  Return to office _________________Call for an appointment ___________________ Patients signature: ______________________ Nurses signature ________________________  Post Anesthesia Care Unit [phone]  DISCHARGE INSTRUCTIONS: D&C / D&E The following instructions have been prepared to help you care for yourself upon your return home.   Personal hygiene:  Use sanitary pads for vaginal drainage, not tampons.  Shower the day after your procedure.  NO tub baths, pools or Jacuzzis for 2-3 weeks.  Wipe front to back after using the bathroom.  Activity and limitations:  Do NOT drive or operate any equipment for 24 hours. The effects of anesthesia are still present and drowsiness may result.  Do NOT rest in bed all day.  Walking is encouraged.  Walk up and down stairs slowly.  You may resume your normal activity in one to two days or as indicated by your physician.  Sexual activity: NO intercourse for at least 2 weeks after the procedure, or as indicated by your physician.  Diet: Eat a light meal as desired this evening. You may resume your usual diet tomorrow.  Return to work: You may resume your work activities in one to two days or as indicated by your doctor.  What to expect after your surgery: Expect to have vaginal bleeding/discharge for 2-3 days and spotting for up to 10 days. It is not unusual to have soreness for up to 1-2 weeks. You may have a slight burning sensation when you urinate for the first day. Mild  cramps may continue for a couple of days. You may have a regular period in 2-6 weeks.  Call your doctor for any of the following:  Excessive vaginal bleeding, saturating and changing one pad every hour.  Inability to urinate 6 hours after discharge from hospital.  Pain not relieved by pain medication.  Fever of 100.4 F or greater.  Unusual vaginal discharge or odor.   Call for an appointment:    Patients signature:  ______________________  Nurses signature ________________________  Support person's signature_______________________      Post Anesthesia Home Care Instructions  Activity: Get plenty of rest for the remainder of the day. A responsible adult should stay with you for 24 hours following the procedure.  For the next 24 hours, DO NOT: -Drive a car -Advertising copywriter -Drink alcoholic beverages -Take any medication unless instructed by your physician -Make any legal decisions or sign important papers.  Meals: Start with liquid foods such as gelatin or soup. Progress to regular foods as tolerated. Avoid greasy, spicy, heavy foods. If nausea and/or vomiting occur, drink only clear liquids until the nausea and/or vomiting subsides. Call your physician if vomiting continues.  Special Instructions/Symptoms: Your throat may feel dry or sore from the anesthesia or the breathing tube placed in your throat during surgery. If this causes discomfort, gargle with warm salt water. The discomfort should disappear within 24 hours.  If you had a scopolamine patch placed behind your ear for the management of post- operative nausea and/or vomiting:  1. The medication in the patch is effective for 72 hours, after which it should be removed.  Wrap patch in a tissue and discard in the trash. Wash hands thoroughly with soap and water. 2. You may remove the patch earlier than 72 hours if you experience unpleasant side effects which may include dry mouth, dizziness or visual disturbances. 3. Avoid touching the patch. Wash your hands with soap and water after contact with the patch.

## 2016-11-22 NOTE — Anesthesia Postprocedure Evaluation (Signed)
Anesthesia Post Note  Patient: Donia Guilesammy S Krygier  Procedure(s) Performed: Procedure(s) (LRB): DILATATION & CURETTAGE/HYSTEROSCOPY WITH MYOSURE (N/A)  Patient location during evaluation: PACU Anesthesia Type: General Level of consciousness: awake and alert Pain management: pain level controlled Vital Signs Assessment: post-procedure vital signs reviewed and stable Respiratory status: spontaneous breathing, nonlabored ventilation and respiratory function stable Cardiovascular status: blood pressure returned to baseline and stable Postop Assessment: no signs of nausea or vomiting Anesthetic complications: no        Last Vitals:  Vitals:   11/22/16 1008 11/22/16 1040  BP: 113/66 120/65  Pulse: (!) 105 (!) 104  Resp: 18 20  Temp: 37.3 C     Last Pain:  Vitals:   11/22/16 0739  TempSrc: Oral   Pain Goal: Patients Stated Pain Goal: 5 (11/22/16 0739)               Erling CruzJACKSON,E. Nile Prisk

## 2016-11-22 NOTE — Brief Op Note (Signed)
11/22/2016  9:15 AM  PATIENT:  Karen Wolfe  41 y.o. female  PRE-OPERATIVE DIAGNOSIS:  Menorrhagia with regular cycles, Endometrial polyp  POST-OPERATIVE DIAGNOSIS:  Menorrhagia with regular cycles, Endometrial polyp  PROCEDURE:  Diagnostic hysteroscopy, hysteroscopic resection of endometrial polyps, dilation and curettage  SURGEON:  Surgeon(s) and Role:    * Maxie BetterSheronette Kainon Varady, MD - Primary  PHYSICIAN ASSISTANT:   ASSISTANTS: none   ANESTHESIA:   general Findings: right fundal/posterior endom polyp, left tubal ostia seen ,right ostia not seen EBL:  Total I/O In: 400 [I.V.:400] Out: 30 [Urine:20; Blood:10]  BLOOD ADMINISTERED:none  DRAINS: none   LOCAL MEDICATIONS USED:  NONE  SPECIMEN:  Source of Specimen:  EMC with endom polyp  DISPOSITION OF SPECIMEN:  PATHOLOGY  COUNTS:  YES  TOURNIQUET:  * No tourniquets in log *  DICTATION: .Other Dictation: Dictation Number [id number]  PLAN OF CARE: Discharge to home after PACU  PATIENT DISPOSITION:  PACU - hemodynamically stable.   Delay start of Pharmacological VTE agent (>24hrs) due to surgical blood loss or risk of bleeding: no

## 2016-11-22 NOTE — H&P (Signed)
Karen Wolfe is an 41 y.o. female MWF presents for surgical mgmt of endometrial polyp noted on sonohysterogram done for c/o heavy cycles with sonographic findings of endometrial thickening. Cycle q month occ skipped (+) clots . Heavy flow x 3-5 days  Pertinent Gynecological History: Menses: flow is excessive with use of 5 pads or tampons on heaviest days Bleeding: menorrhagia Contraception: vasectomy DES exposure: denies Blood transfusions: none Sexually transmitted diseases: no past history Previous GYN Procedures: none  Last mammogram: normal Date: 2017 Last pap: normal Date:2017 OB History: G2, P2   Menstrual History: Menarche age: n/a Patient's last menstrual period was 11/08/2016 (exact date).    Past Medical History:  Diagnosis Date  . Anxiety   . Asthma   . Depression   . GERD (gastroesophageal reflux disease)    ? silent reflux  . History of kidney stones   . PONV (postoperative nausea and vomiting)   . Shortness of breath dyspnea    with exertion- upper airway stricture  . Vaginal delivery    epidural x2     Past Surgical History:  Procedure Laterality Date  . MICROLARYNGOSCOPY WITH DILATION N/A 08/09/2015   Procedure: MICROLARYNGOSCOPY WITH DILATION;  Surgeon: Christia Readingwight Bates, MD;  Location: [facility] OR;  Service: ENT;  Laterality: N/A;  MICRO DIRECT LARYNGOSCOPY WITH CO2 LASER DILATION/JET VENTILATION  . VARICOSE VEIN SURGERY    . WISDOM TOOTH EXTRACTION      History reviewed. No pertinent family history.  Social History:  reports that she has never smoked. She has never used smokeless tobacco. She reports that she does not drink alcohol or use drugs.  Allergies:  Allergies  Allergen Reactions  . Mold Extract [Trichophyton] Shortness Of Breath  . Shellfish Allergy Shortness Of Breath and Nausea And Vomiting  . Fentanyl Nausea And Vomiting    Prescriptions Prior to Admission  Medication Sig Dispense Refill Last Dose  . buPROPion (WELLBUTRIN XL) 150 MG 24 hr  tablet Take 150 mg by mouth daily.   11/22/2016 at 0600  . ibuprofen (ADVIL,MOTRIN) 200 MG tablet Take 400 mg by mouth daily as needed for headache or moderate pain.   11/21/2016 at Unknown time  . omeprazole (PRILOSEC) 40 MG capsule Take 40 mg by mouth daily.   11/22/2016 at 0600  . sertraline (ZOLOFT) 50 MG tablet Take 50 mg by mouth daily.   11/22/2016 at 0600  . acetaminophen (TYLENOL) 325 MG tablet Take 650 mg by mouth daily as needed for moderate pain or headache.   More than a month at Unknown time  . cetirizine (ZYRTEC) 10 MG tablet Take 10 mg by mouth daily as needed for allergies.   More than a month at Unknown time  . loratadine (CLARITIN) 10 MG tablet Take 10 mg by mouth daily as needed for allergies.    More than a month at Unknown time  . XOPENEX HFA 45 MCG/ACT inhaler Inhale 1 puff into the lungs every 4 (four) hours as needed for wheezing or shortness of breath.   3 More than a month at Unknown time    Review of Systems  Psychiatric/Behavioral: Positive for depression.  All other systems reviewed and are negative.   Blood pressure 131/81, pulse (!) 104, temperature 99.3 F (37.4 C), temperature source Oral, resp. rate 16, height 5\' 3"  (1.6 m), weight 82.6 kg (182 lb), last menstrual period 11/08/2016, SpO2 97 %. Physical Exam  Constitutional: She appears well-developed and well-nourished.  HENT:  Head: Atraumatic.  Eyes: EOM are normal.  Neck: Neck supple.  Cardiovascular: Regular rhythm.   Respiratory: Breath sounds normal.  GI: Soft.  Genitourinary: Vagina normal and uterus normal.  Skin: Skin is warm and dry.  Psychiatric: She has a normal mood and affect.    Results for orders placed or performed during the hospital encounter of 11/22/16 (from the past 24 hour(s))  CBC     Status: None   Collection Time: 11/22/16  7:30 AM  Result Value Ref Range   WBC 6.4 4.0 - 10.5 K/uL   RBC 4.31 3.87 - 5.11 MIL/uL   Hemoglobin 12.1 12.0 - 15.0 g/dL   HCT 16.1 09.6 - 04.5 %    MCV 85.2 78.0 - 100.0 fL   MCH 28.1 26.0 - 34.0 pg   MCHC 33.0 30.0 - 36.0 g/dL   RDW 40.9 81.1 - 91.4 %   Platelets 363 150 - 400 K/uL    No results found.  Assessment/Plan: Menorrhagia with irreg cycles Endometrial polyp P) dx hysteroscopy, D&C, resection of endometrial polyp using myosure. Risk of procedure includes infection, bleeding, uterine perforation and its risk, thermal injury, fluid overload. All ? answered  Renald Haithcock A 11/22/2016, 7:55 AM

## 2016-11-23 NOTE — Addendum Note (Signed)
Addendum  created 11/23/16 1634 by Algis GreenhouseLinda A Inetha Maret, CRNA   Charge Capture section accepted

## 2016-11-23 NOTE — Op Note (Addendum)
[name], [name]                ACCOUNT NO.:  [id number]  MEDICAL RECORD NO.:  [id number]  LOCATION:                                 FACILITY:  PHYSICIAN:  Maxie BetterSheronette Annison Birchard, M.D.DATE OF BIRTH:  19-May-1976  DATE OF PROCEDURE:  11/22/2016 DATE OF DISCHARGE:                              OPERATIVE REPORT   PREOPERATIVE DIAGNOSIS:  Menorrhagia with irregular cycles, endometrial polyp.  PROCEDURE:  Diagnostic hysteroscopy, hysteroscopic resection of endometrial polyps using MyoSure, dilation and curettage.  POSTOPERATIVE DIAGNOSIS:  Menorrhagia with irregular cycles, endometrial polyp.  ANESTHESIA:  General.  SURGEON:  Maxie BetterSheronette Meagen Limones, MD  ASSISTANT:  None.  PROCEDURE IN DETAIL:  Under adequate general anesthesia, the patient was placed in a dorsal lithotomy position.  She was sterilely prepped and draped in usual fashion.  The bladder was catheterized with mild amount of urine.  Examination under anesthesia reveals an anteverted axial uterus.  No adnexal masses could be appreciated.  Bivalve speculum was placed in vagina.  Single-tooth tenaculum was placed on the anterior lip of the cervix.  The cervix was serially dilated up to [facility] dilator. A MyoSure hysteroscope was introduced into the uterine cavity.  The right fundal area had a polypoid mass.  The left tubal ostia could be seen, the right was not seen.  Endocervical canal was without any lesion.  Using the Lite MyoSure resectoscope, the polyp was resected as well as any other thickened endometrial areas.  Once this was removed, the instrument was removed.  The cavity was then curetted for scant amount of tissue.  All instruments were then removed from the vagina.  SPECIMEN LABELED:  Endometrial polyp with endometrial curettings were sent to Pathology.  ESTIMATED BLOOD LOSS:  Minimal. URINE OUTPUT: 20 ml IN: 400 ml COMPLICATION:  None.  The patient tolerated the procedure well, was transferred to recovery  room in stable condition.     Maxie BetterSheronette Zeferino Mounts, M.D.   ______________________________ Maxie BetterSheronette Everson Mott, M.D.    [place]/MEDQ  D:  11/22/2016  T:  11/23/2016  Job:  [id number]

## 2016-11-24 ENCOUNTER — Encounter (HOSPITAL_COMMUNITY): Payer: Self-pay | Admitting: Obstetrics and Gynecology

## 2016-12-05 DIAGNOSIS — R0683 Snoring: Secondary | ICD-10-CM | POA: Diagnosis not present

## 2016-12-10 DIAGNOSIS — J209 Acute bronchitis, unspecified: Secondary | ICD-10-CM | POA: Diagnosis not present

## 2016-12-10 DIAGNOSIS — J00 Acute nasopharyngitis [common cold]: Secondary | ICD-10-CM | POA: Diagnosis not present

## 2016-12-13 ENCOUNTER — Other Ambulatory Visit (HOSPITAL_BASED_OUTPATIENT_CLINIC_OR_DEPARTMENT_OTHER): Payer: Self-pay

## 2016-12-13 DIAGNOSIS — R0683 Snoring: Secondary | ICD-10-CM

## 2016-12-13 DIAGNOSIS — G4730 Sleep apnea, unspecified: Secondary | ICD-10-CM

## 2016-12-14 DIAGNOSIS — J069 Acute upper respiratory infection, unspecified: Secondary | ICD-10-CM | POA: Diagnosis not present

## 2016-12-19 ENCOUNTER — Ambulatory Visit: Payer: BLUE CROSS/BLUE SHIELD | Attending: Otolaryngology | Admitting: Internal Medicine

## 2016-12-19 DIAGNOSIS — R0683 Snoring: Secondary | ICD-10-CM

## 2016-12-19 DIAGNOSIS — G4730 Sleep apnea, unspecified: Secondary | ICD-10-CM

## 2016-12-19 DIAGNOSIS — G4733 Obstructive sleep apnea (adult) (pediatric): Secondary | ICD-10-CM | POA: Diagnosis not present

## 2017-01-13 NOTE — Procedures (Signed)
  [facility] Loula Marcella A. Gerilyn Pilgrimoonquah, MD     [email or web address]        HOME SLEEP STUDY   LOCATION: SLEEP LAB FACILITY:    PHYSICIAN: Jennings Corado A. Gerilyn Pilgrimoonquah, M.D.   DATE OF STUDY: 12/19/2016   INDICATIONS: Snoring and fatigue.  MEDICATIONS:  Prior to Admission medications   Medication Sig Start Date End Date Taking? Authorizing Provider  acetaminophen (TYLENOL) 325 MG tablet Take 650 mg by mouth daily as needed for moderate pain or headache.    Historical Provider, MD  buPROPion (WELLBUTRIN XL) 150 MG 24 hr tablet Take 150 mg by mouth daily. 07/13/15   Historical Provider, MD  cetirizine (ZYRTEC) 10 MG tablet Take 10 mg by mouth daily as needed for allergies.    Historical Provider, MD  ibuprofen (ADVIL,MOTRIN) 800 MG tablet Take 0.5 tablets (400 mg total) by mouth every 6 (six) hours as needed for headache or moderate pain. 11/22/16   Maxie BetterSheronette Cousins, MD  loratadine (CLARITIN) 10 MG tablet Take 10 mg by mouth daily as needed for allergies.     Historical Provider, MD  omeprazole (PRILOSEC) 40 MG capsule Take 40 mg by mouth daily. 07/13/15   Historical Provider, MD  sertraline (ZOLOFT) 50 MG tablet Take 50 mg by mouth daily. 07/13/15   Historical Provider, MD  XOPENEX HFA 45 MCG/ACT inhaler Inhale 1 puff into the lungs every 4 (four) hours as needed for wheezing or shortness of breath.  06/01/15   Historical Provider, MD      RESPIRATORY DATA:  Baseline oxygen saturation is 98 %. The lowest saturation is 81 %. The diagnostic AHI is 17.   IMPRESSION:  Moderate sleep apnea is noted. A formal CPAP titration study is recommended.   Thanks for this referral.  Maninder Deboer A. Gerilyn Pilgrimoonquah, M.D. Diplomat, Biomedical engineerAmerican Board of Sleep Medicine.

## 2017-01-20 NOTE — Procedures (Signed)
   Patient Name: Karen Wolfe, [place] Study Date: 12/19/2016 Gender: Female D.O.B: 1976-09-17 Age (years): 41 Referring Provider: Christia Readingwight Bates Height (inches): 63 Interpreting Physician: Jetty Duhamellinton Young MD, ABSM Weight (lbs): 180 RPSGT: Peak, Robert BMI: 32 MRN: [id number] Neck Size: CLINICAL INFORMATION Sleep Study Type:  unattended HST     Indication for sleep study: Snoring     Epworth Sleepiness Score: NA  SLEEP STUDY TECHNIQUE A multi-channel overnight portable sleep study was performed. The channels recorded were: nasal airflow, thoracic respiratory movement, and oxygen saturation with a pulse oximetry. Snoring was also monitored.  MEDICATIONS Patient self administered medications include: none reported.  SLEEP ARCHITECTURE Patient was studied for 479.5 minutes. The sleep efficiency was 100.0 % and the patient was supine for 0%. The arousal index was 0.0 per hour.  RESPIRATORY PARAMETERS The overall AHI was 16.5 per hour, with a central apnea index of 2.0 per hour.  The oxygen nadir was 81% during sleep.  CARDIAC DATA Mean heart rate during sleep was bpm.  IMPRESSIONS - Moderate obstructive sleep apnea occurred during this study (AHI = 16.5/h). - No significant central sleep apnea occurred during this study (CAI = 2.0/h). - Moderate oxygen desaturation was noted during this study (Min O2 = 81%). - Snoring monitor not reported  DIAGNOSIS - Obstructive Sleep Apnea (327.23 [G47.33 ICD-10])  RECOMMENDATIONS - Recommend CPAP titration. Alternatives would be based on clinical judgment. - Avoid alcohol, sedatives and other CNS depressants that may worsen sleep apnea and disrupt normal sleep architecture. - Sleep hygiene should be reviewed to assess factors that may improve sleep quality. - Weight management and regular exercise should be initiated or continued.  [Electronically signed] 01/20/2017 01:21 PM  Jetty Duhamellinton Young MD, ABSM Diplomate, American Board of Sleep  Medicine   NPI: [id number]  Waymon BudgeYOUNG,CLINTON D Diplomate, American Board of Sleep Medicine  ELECTRONICALLY SIGNED ON:  01/20/2017, 1:18 PM [facility] PH: [phone]   FX: [phone] ACCREDITED BY THE AMERICAN ACADEMY OF SLEEP MEDICINE

## 2017-02-07 DIAGNOSIS — G4733 Obstructive sleep apnea (adult) (pediatric): Secondary | ICD-10-CM | POA: Diagnosis not present

## 2017-03-09 DIAGNOSIS — G4733 Obstructive sleep apnea (adult) (pediatric): Secondary | ICD-10-CM | POA: Diagnosis not present

## 2017-04-09 DIAGNOSIS — G4733 Obstructive sleep apnea (adult) (pediatric): Secondary | ICD-10-CM | POA: Diagnosis not present

## 2017-05-09 DIAGNOSIS — G4733 Obstructive sleep apnea (adult) (pediatric): Secondary | ICD-10-CM | POA: Diagnosis not present

## 2017-06-09 DIAGNOSIS — G4733 Obstructive sleep apnea (adult) (pediatric): Secondary | ICD-10-CM | POA: Diagnosis not present

## 2017-07-10 DIAGNOSIS — G4733 Obstructive sleep apnea (adult) (pediatric): Secondary | ICD-10-CM | POA: Diagnosis not present

## 2017-07-19 DIAGNOSIS — R52 Pain, unspecified: Secondary | ICD-10-CM | POA: Diagnosis not present

## 2017-07-19 DIAGNOSIS — J029 Acute pharyngitis, unspecified: Secondary | ICD-10-CM | POA: Diagnosis not present

## 2017-07-19 DIAGNOSIS — J208 Acute bronchitis due to other specified organisms: Secondary | ICD-10-CM | POA: Diagnosis not present

## 2017-07-19 DIAGNOSIS — J4520 Mild intermittent asthma, uncomplicated: Secondary | ICD-10-CM | POA: Diagnosis not present

## 2017-08-07 DIAGNOSIS — J069 Acute upper respiratory infection, unspecified: Secondary | ICD-10-CM | POA: Diagnosis not present

## 2017-08-07 DIAGNOSIS — F411 Generalized anxiety disorder: Secondary | ICD-10-CM | POA: Diagnosis not present

## 2017-08-09 DIAGNOSIS — G4733 Obstructive sleep apnea (adult) (pediatric): Secondary | ICD-10-CM | POA: Diagnosis not present

## 2017-12-03 DIAGNOSIS — T783XXA Angioneurotic edema, initial encounter: Secondary | ICD-10-CM | POA: Diagnosis not present

## 2017-12-03 DIAGNOSIS — R600 Localized edema: Secondary | ICD-10-CM | POA: Diagnosis not present

## 2017-12-15 DIAGNOSIS — J069 Acute upper respiratory infection, unspecified: Secondary | ICD-10-CM | POA: Diagnosis not present

## 2017-12-15 DIAGNOSIS — J4521 Mild intermittent asthma with (acute) exacerbation: Secondary | ICD-10-CM | POA: Diagnosis not present

## 2018-01-23 ENCOUNTER — Other Ambulatory Visit: Payer: Self-pay | Admitting: Allergy and Immunology

## 2018-01-23 ENCOUNTER — Ambulatory Visit
Admission: RE | Admit: 2018-01-23 | Discharge: 2018-01-23 | Disposition: A | Discharge status: Home / Self Care | Payer: BLUE CROSS/BLUE SHIELD | Source: Ambulatory Visit | Attending: Allergy and Immunology | Admitting: Allergy and Immunology

## 2018-01-23 DIAGNOSIS — J301 Allergic rhinitis due to pollen: Secondary | ICD-10-CM | POA: Diagnosis not present

## 2018-01-23 DIAGNOSIS — H1045 Other chronic allergic conjunctivitis: Secondary | ICD-10-CM | POA: Diagnosis not present

## 2018-01-23 DIAGNOSIS — J4530 Mild persistent asthma, uncomplicated: Secondary | ICD-10-CM

## 2018-01-23 DIAGNOSIS — R05 Cough: Secondary | ICD-10-CM | POA: Diagnosis not present

## 2018-01-23 DIAGNOSIS — J3089 Other allergic rhinitis: Secondary | ICD-10-CM | POA: Diagnosis not present

## 2018-02-05 DIAGNOSIS — F411 Generalized anxiety disorder: Secondary | ICD-10-CM | POA: Diagnosis not present

## 2018-06-24 DIAGNOSIS — J4521 Mild intermittent asthma with (acute) exacerbation: Secondary | ICD-10-CM | POA: Diagnosis not present

## 2018-06-30 IMAGING — CR DG CHEST 2V
2 series · 2 of 2 positions shown · non-contrast
Comparison: 08/03/2015

CLINICAL DATA: Asthma and cough.

EXAM:
CHEST - 2 VIEW

[w chest pa]
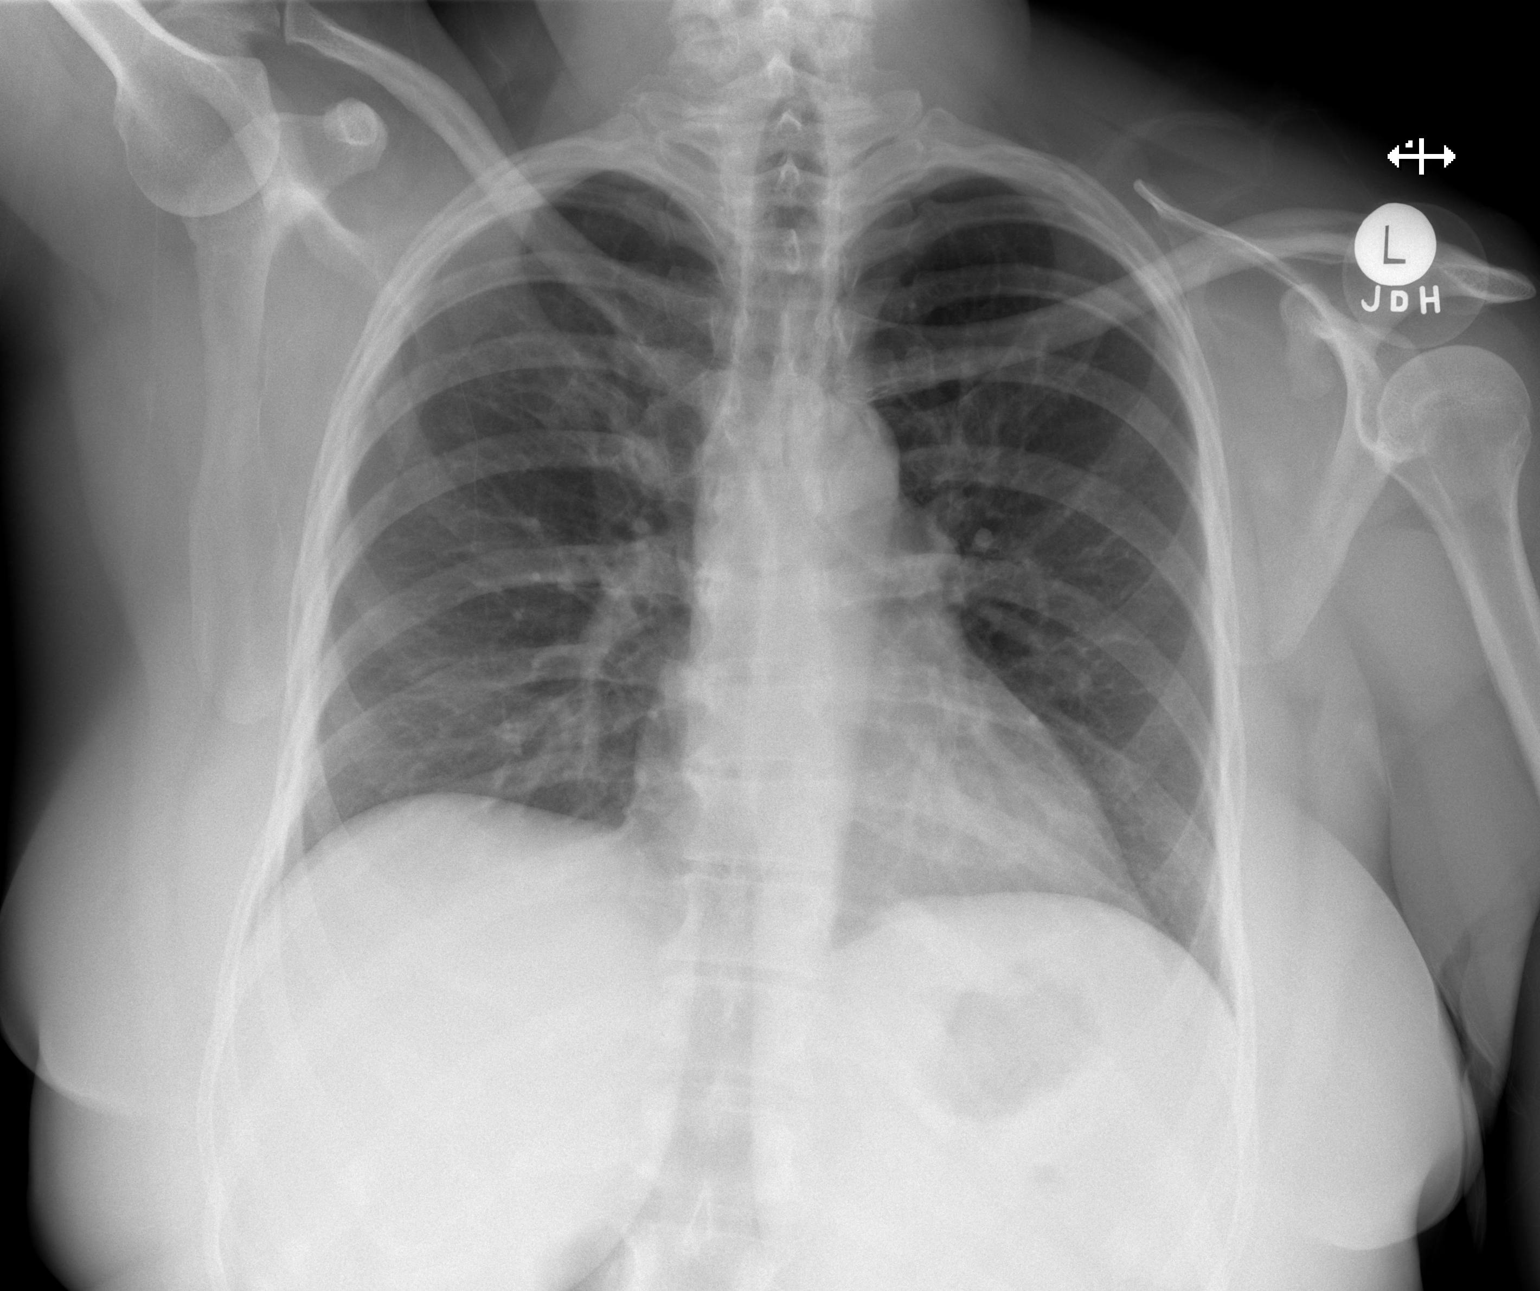

[w chest lat]
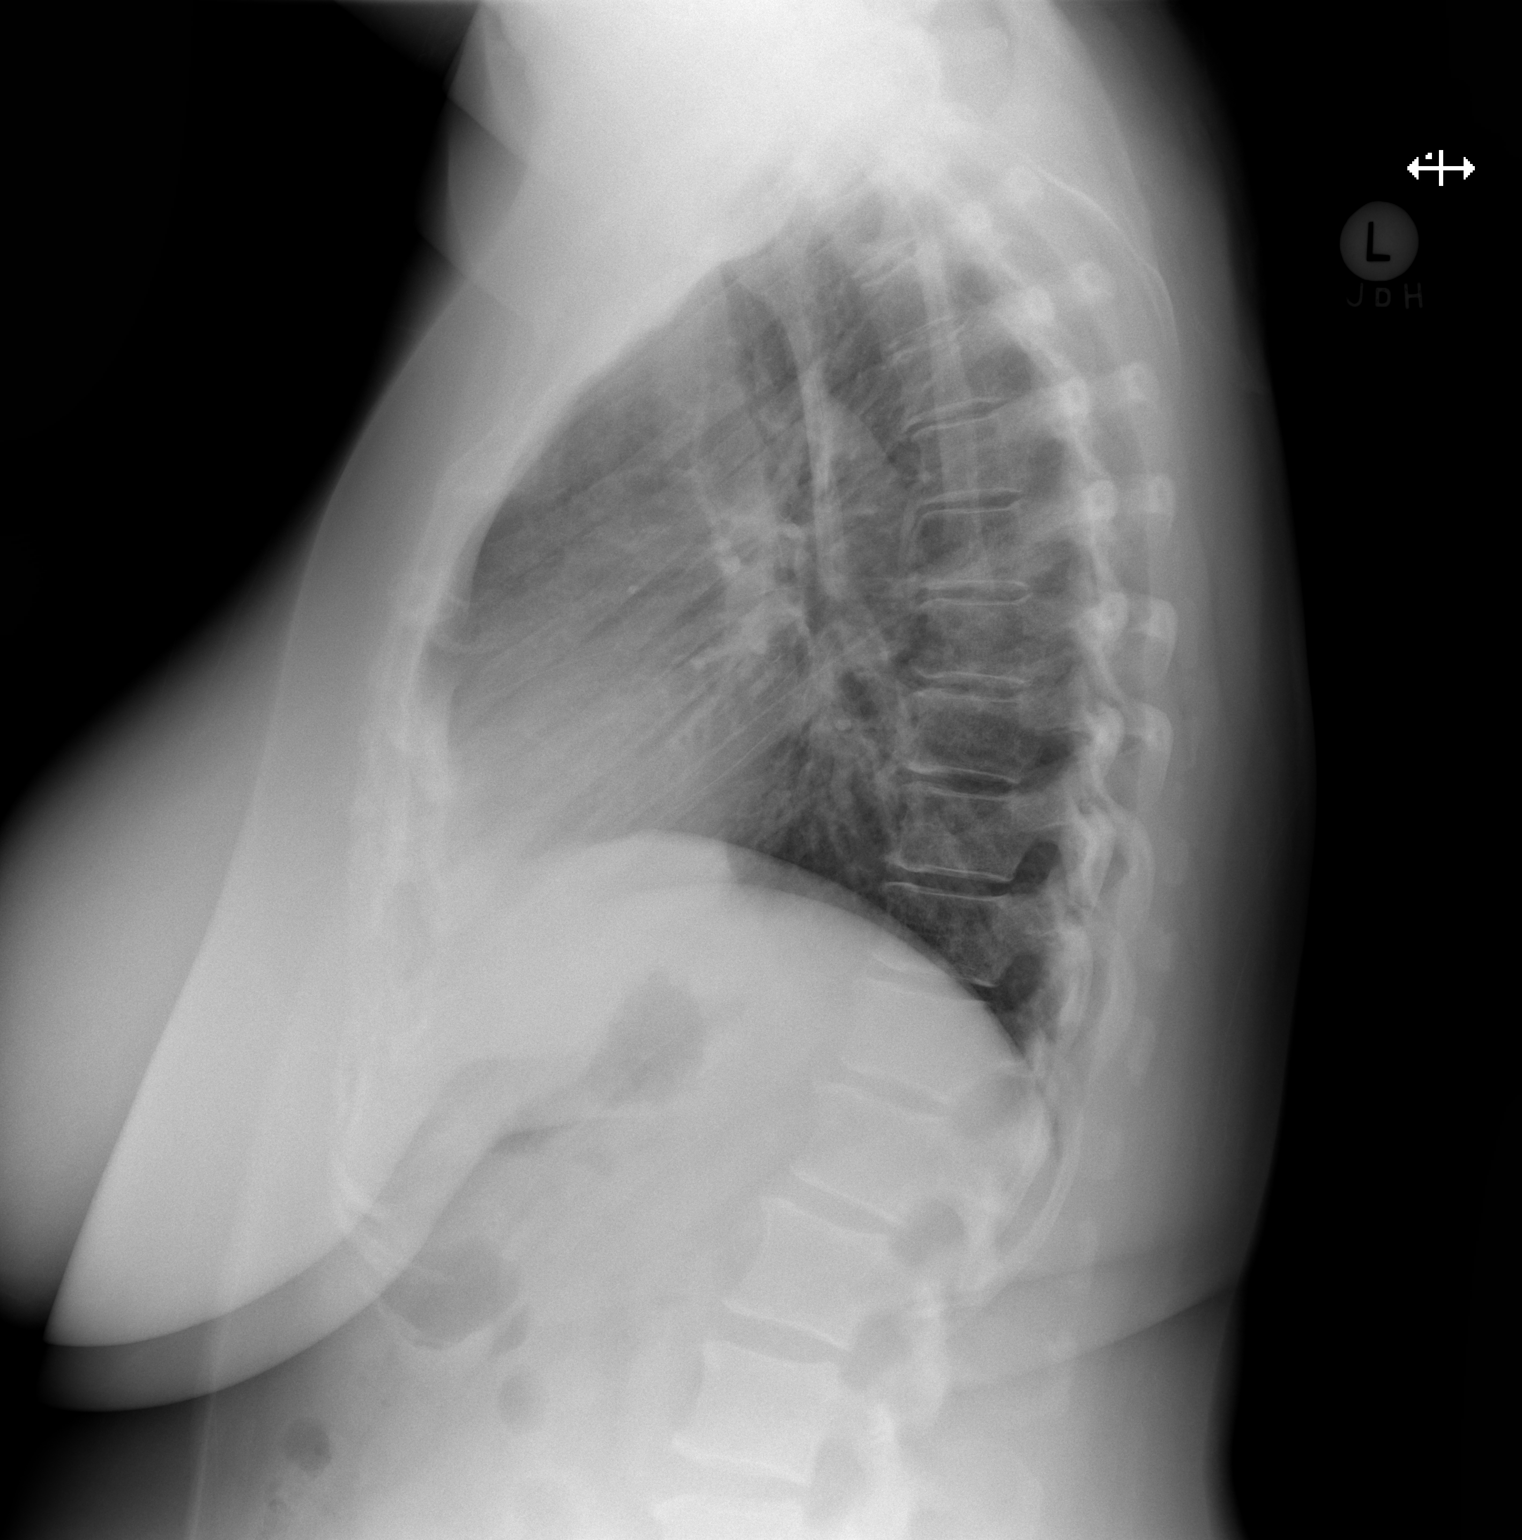

[2 of 2 positions shown; findings below may reference images not displayed]

FINDINGS: The heart size and mediastinal contours are within normal limits.
Both lungs are clear. The visualized skeletal structures are
unremarkable.
IMPRESSION: No active cardiopulmonary disease.

## 2018-08-07 DIAGNOSIS — J4530 Mild persistent asthma, uncomplicated: Secondary | ICD-10-CM | POA: Diagnosis not present

## 2018-08-07 DIAGNOSIS — H1045 Other chronic allergic conjunctivitis: Secondary | ICD-10-CM | POA: Diagnosis not present

## 2018-08-07 DIAGNOSIS — J301 Allergic rhinitis due to pollen: Secondary | ICD-10-CM | POA: Diagnosis not present

## 2018-08-07 DIAGNOSIS — J3089 Other allergic rhinitis: Secondary | ICD-10-CM | POA: Diagnosis not present

## 2018-08-14 DIAGNOSIS — J301 Allergic rhinitis due to pollen: Secondary | ICD-10-CM | POA: Diagnosis not present

## 2018-08-15 DIAGNOSIS — J3089 Other allergic rhinitis: Secondary | ICD-10-CM | POA: Diagnosis not present

## 2018-08-28 DIAGNOSIS — J301 Allergic rhinitis due to pollen: Secondary | ICD-10-CM | POA: Diagnosis not present

## 2018-08-28 DIAGNOSIS — J3089 Other allergic rhinitis: Secondary | ICD-10-CM | POA: Diagnosis not present

## 2018-09-05 DIAGNOSIS — J386 Stenosis of larynx: Secondary | ICD-10-CM | POA: Diagnosis not present

## 2018-09-17 DIAGNOSIS — M545 Low back pain: Secondary | ICD-10-CM | POA: Diagnosis not present

## 2018-10-09 ENCOUNTER — Other Ambulatory Visit: Payer: Self-pay | Admitting: Otolaryngology

## 2018-10-18 ENCOUNTER — Ambulatory Visit (HOSPITAL_COMMUNITY): Admission: RE | Admit: 2018-10-18 | Payer: BLUE CROSS/BLUE SHIELD | Source: Ambulatory Visit | Admitting: Otolaryngology

## 2018-10-18 ENCOUNTER — Encounter (HOSPITAL_COMMUNITY): Admission: RE | Payer: Self-pay | Source: Ambulatory Visit

## 2018-10-18 SURGERY — MICROLARYNGOSCOPY WITH DILATION
Anesthesia: General

## 2018-11-12 ENCOUNTER — Other Ambulatory Visit: Payer: Self-pay | Admitting: Otolaryngology

## 2018-11-15 ENCOUNTER — Encounter (HOSPITAL_COMMUNITY): Payer: Self-pay | Admitting: *Deleted

## 2018-11-15 ENCOUNTER — Other Ambulatory Visit: Payer: Self-pay

## 2018-11-15 NOTE — Progress Notes (Signed)
Pt denies chest pain and being under the care of a cardiologist but stated that she has DOE. Pt denies having a stress test, echo and cardiac cath. Pt denies having an EKG within the last year. Pt made aware to stop taking  Aspirin, vitamins, fish oil and herbal medications. Do not take any NSAIDs ie: Ibuprofen, Advil, Naproxen (Aleve), Motrin, BC and Goody Powder. Pt verbalized understanding of all pre-op instructions.

## 2018-11-18 ENCOUNTER — Ambulatory Visit (HOSPITAL_COMMUNITY)
Admission: RE | Admit: 2018-11-18 | Discharge: 2018-11-18 | Disposition: A | Discharge status: Home / Self Care | Payer: BLUE CROSS/BLUE SHIELD | Attending: Otolaryngology | Admitting: Otolaryngology

## 2018-11-18 ENCOUNTER — Other Ambulatory Visit: Payer: Self-pay

## 2018-11-18 ENCOUNTER — Encounter (HOSPITAL_COMMUNITY): Payer: Self-pay | Admitting: Surgery

## 2018-11-18 ENCOUNTER — Ambulatory Visit (HOSPITAL_COMMUNITY): Payer: BLUE CROSS/BLUE SHIELD | Admitting: Certified Registered"

## 2018-11-18 ENCOUNTER — Encounter (HOSPITAL_COMMUNITY)
Admission: RE | Disposition: A | Discharge status: Home / Self Care | Payer: Self-pay | Source: Home / Self Care | Attending: Otolaryngology

## 2018-11-18 DIAGNOSIS — J45909 Unspecified asthma, uncomplicated: Secondary | ICD-10-CM | POA: Diagnosis not present

## 2018-11-18 DIAGNOSIS — Z79899 Other long term (current) drug therapy: Secondary | ICD-10-CM | POA: Diagnosis not present

## 2018-11-18 DIAGNOSIS — F419 Anxiety disorder, unspecified: Secondary | ICD-10-CM | POA: Diagnosis not present

## 2018-11-18 DIAGNOSIS — J386 Stenosis of larynx: Secondary | ICD-10-CM | POA: Insufficient documentation

## 2018-11-18 DIAGNOSIS — K219 Gastro-esophageal reflux disease without esophagitis: Secondary | ICD-10-CM | POA: Insufficient documentation

## 2018-11-18 DIAGNOSIS — G4730 Sleep apnea, unspecified: Secondary | ICD-10-CM | POA: Diagnosis not present

## 2018-11-18 HISTORY — DX: Sleep apnea, unspecified: G47.30

## 2018-11-18 HISTORY — DX: Pneumonia, unspecified organism: J18.9

## 2018-11-18 HISTORY — DX: Anemia, unspecified: D64.9

## 2018-11-18 HISTORY — DX: Stenosis of larynx: J38.6

## 2018-11-18 HISTORY — PX: MICROLARYNGOSCOPY WITH DILATION: SHX5971

## 2018-11-18 LAB — CBC
HCT: 40.3 % (ref 36.0–46.0)
Hemoglobin: 12.6 g/dL (ref 12.0–15.0)
MCH: 26.9 pg (ref 26.0–34.0)
MCHC: 31.3 g/dL (ref 30.0–36.0)
MCV: 86.1 fL (ref 80.0–100.0)
Platelets: 417 10*3/uL — ABNORMAL HIGH (ref 150–400)
RBC: 4.68 MIL/uL (ref 3.87–5.11)
RDW: 13.8 % (ref 11.5–15.5)
WBC: 8.2 10*3/uL (ref 4.0–10.5)
nRBC: 0 % (ref 0.0–0.2)

## 2018-11-18 LAB — POCT PREGNANCY, URINE: Preg Test, Ur: NEGATIVE

## 2018-11-18 SURGERY — MICROLARYNGOSCOPY WITH DILATION
Anesthesia: General

## 2018-11-18 MED ORDER — MITOMYCIN-C INJECTION USE IN OR ONLY (0.4 MG/ML)
0.5000 mL | Freq: Once | INTRAVENOUS | Status: AC
  Administered 2018-11-18: 0.5 mL
  Filled 2018-11-18: qty 0.5

## 2018-11-18 MED ORDER — ONDANSETRON HCL 4 MG/2ML IJ SOLN
INTRAMUSCULAR | Status: AC
  Filled 2018-11-18: qty 2

## 2018-11-18 MED ORDER — 0.9 % SODIUM CHLORIDE (POUR BTL) OPTIME
TOPICAL | Status: DC | PRN
  Administered 2018-11-18: 1000 mL

## 2018-11-18 MED ORDER — EPINEPHRINE PF 1 MG/ML IJ SOLN
INTRAMUSCULAR | Status: DC | PRN
  Administered 2018-11-18: 1 mg

## 2018-11-18 MED ORDER — SODIUM CHLORIDE 0.9 % IV SOLN
INTRAVENOUS | Status: DC | PRN
  Administered 2018-11-18: .1 ug/kg/min via INTRAVENOUS

## 2018-11-18 MED ORDER — HYDROMORPHONE HCL 1 MG/ML IJ SOLN
INTRAMUSCULAR | Status: AC
  Administered 2018-11-18: 0.5 mg via INTRAVENOUS
  Filled 2018-11-18: qty 1

## 2018-11-18 MED ORDER — SUGAMMADEX SODIUM 200 MG/2ML IV SOLN
INTRAVENOUS | Status: DC | PRN
  Administered 2018-11-18: 200 mg via INTRAVENOUS

## 2018-11-18 MED ORDER — LIDOCAINE 2% (20 MG/ML) 5 ML SYRINGE
INTRAMUSCULAR | Status: AC
  Filled 2018-11-18: qty 5

## 2018-11-18 MED ORDER — LACTATED RINGERS IV SOLN
INTRAVENOUS | Status: DC
  Administered 2018-11-18: 07:00:00 via INTRAVENOUS

## 2018-11-18 MED ORDER — PROPOFOL 500 MG/50ML IV EMUL
INTRAVENOUS | Status: DC | PRN
  Administered 2018-11-18: 100 ug/kg/min via INTRAVENOUS

## 2018-11-18 MED ORDER — LIDOCAINE 2% (20 MG/ML) 5 ML SYRINGE
INTRAMUSCULAR | Status: DC | PRN
  Administered 2018-11-18: 40 mg via INTRAVENOUS

## 2018-11-18 MED ORDER — MIDAZOLAM HCL 2 MG/2ML IJ SOLN
INTRAMUSCULAR | Status: AC
  Filled 2018-11-18: qty 2

## 2018-11-18 MED ORDER — OXYCODONE HCL 5 MG PO TABS: 5.0000 mg | ORAL_TABLET | Freq: Once | ORAL | Status: DC | PRN

## 2018-11-18 MED ORDER — ROCURONIUM BROMIDE 50 MG/5ML IV SOSY
PREFILLED_SYRINGE | INTRAVENOUS | Status: DC | PRN
  Administered 2018-11-18: 10 mg via INTRAVENOUS
  Administered 2018-11-18: 50 mg via INTRAVENOUS

## 2018-11-18 MED ORDER — ONDANSETRON HCL 4 MG/2ML IJ SOLN
INTRAMUSCULAR | Status: DC | PRN
  Administered 2018-11-18: 4 mg via INTRAVENOUS

## 2018-11-18 MED ORDER — FENTANYL CITRATE (PF) 100 MCG/2ML IJ SOLN
INTRAMUSCULAR | Status: DC | PRN
  Administered 2018-11-18: 50 ug via INTRAVENOUS

## 2018-11-18 MED ORDER — EPINEPHRINE PF 1 MG/ML IJ SOLN
INTRAMUSCULAR | Status: AC
  Filled 2018-11-18: qty 1

## 2018-11-18 MED ORDER — ONDANSETRON HCL 4 MG/2ML IJ SOLN
4.0000 mg | Freq: Once | INTRAMUSCULAR | Status: AC | PRN
  Administered 2018-11-18: 4 mg via INTRAVENOUS

## 2018-11-18 MED ORDER — OXYCODONE HCL 5 MG/5ML PO SOLN: 5.0000 mg | Freq: Once | ORAL | Status: DC | PRN

## 2018-11-18 MED ORDER — ROCURONIUM BROMIDE 50 MG/5ML IV SOSY
PREFILLED_SYRINGE | INTRAVENOUS | Status: AC
  Filled 2018-11-18: qty 5

## 2018-11-18 MED ORDER — EPINEPHRINE HCL (NASAL) 0.1 % NA SOLN
NASAL | Status: AC
  Filled 2018-11-18: qty 30

## 2018-11-18 MED ORDER — PROPOFOL 10 MG/ML IV BOLUS
INTRAVENOUS | Status: AC
  Filled 2018-11-18: qty 20

## 2018-11-18 MED ORDER — DEXAMETHASONE SODIUM PHOSPHATE 10 MG/ML IJ SOLN
INTRAMUSCULAR | Status: AC
  Filled 2018-11-18: qty 1

## 2018-11-18 MED ORDER — MIDAZOLAM HCL 2 MG/2ML IJ SOLN
INTRAMUSCULAR | Status: DC | PRN
  Administered 2018-11-18: 2 mg via INTRAVENOUS

## 2018-11-18 MED ORDER — PROPOFOL 1000 MG/100ML IV EMUL
INTRAVENOUS | Status: AC
  Filled 2018-11-18: qty 100

## 2018-11-18 MED ORDER — HYDROMORPHONE HCL 1 MG/ML IJ SOLN
0.2500 mg | INTRAMUSCULAR | Status: DC | PRN
  Administered 2018-11-18 (×2): 0.5 mg via INTRAVENOUS

## 2018-11-18 MED ORDER — DEXAMETHASONE SODIUM PHOSPHATE 10 MG/ML IJ SOLN
INTRAMUSCULAR | Status: DC | PRN
  Administered 2018-11-18: 10 mg via INTRAVENOUS

## 2018-11-18 MED ORDER — PROPOFOL 10 MG/ML IV BOLUS
INTRAVENOUS | Status: DC | PRN
  Administered 2018-11-18: 130 mg via INTRAVENOUS

## 2018-11-18 MED ORDER — FENTANYL CITRATE (PF) 250 MCG/5ML IJ SOLN
INTRAMUSCULAR | Status: AC
  Filled 2018-11-18: qty 5

## 2018-11-18 SURGICAL SUPPLY — 35 items
BALLN PULM 15 16.5 18X75 (BALLOONS) ×2
BALLOON PULM 15 16.5 18X75 (BALLOONS) IMPLANT
BANDAGE EYE OVAL (MISCELLANEOUS) ×4 IMPLANT
BIT DRILL QC 2.7MM (BIT) IMPLANT
BLADE SURG 15 STRL LF DISP TIS (BLADE) IMPLANT
BLADE SURG 15 STRL SS (BLADE)
CANISTER SUCT 3000ML PPV (MISCELLANEOUS) ×2 IMPLANT
CATH THERM 7FR 4MM 5031TK1 (ABLATOR) ×1 IMPLANT
CONT SPEC 4OZ CLIKSEAL STRL BL (MISCELLANEOUS) IMPLANT
COVER BACK TABLE 60X90IN (DRAPES) ×2 IMPLANT
COVER WAND RF STERILE (DRAPES) ×1 IMPLANT
CRADLE DONUT ADULT HEAD (MISCELLANEOUS) ×1 IMPLANT
DRAPE HALF SHEET 40X57 (DRAPES) ×2 IMPLANT
GAUZE SPONGE 4X4 12PLY STRL (GAUZE/BANDAGES/DRESSINGS) ×1 IMPLANT
GLOVE BIO SURGEON STRL SZ7.5 (GLOVE) ×2 IMPLANT
GOWN STRL REUS W/ TWL LRG LVL3 (GOWN DISPOSABLE) IMPLANT
GOWN STRL REUS W/TWL LRG LVL3 (GOWN DISPOSABLE)
KIT BASIN OR (CUSTOM PROCEDURE TRAY) ×2 IMPLANT
KIT TURNOVER KIT B (KITS) ×2 IMPLANT
NDL HYPO 25GX1X1/2 BEV (NEEDLE) IMPLANT
NEEDLE HYPO 25GX1X1/2 BEV (NEEDLE) IMPLANT
NS IRRIG 1000ML POUR BTL (IV SOLUTION) ×2 IMPLANT
PAD ARMBOARD 7.5X6 YLW CONV (MISCELLANEOUS) ×4 IMPLANT
PATTIES SURGICAL .5 X3 (DISPOSABLE) ×3 IMPLANT
PROS DRILL BIT QC 2.7MM (BIT) ×2
SOLUTION ANTI FOG 6CC (MISCELLANEOUS) IMPLANT
SURGILUBE 2OZ TUBE FLIPTOP (MISCELLANEOUS) IMPLANT
SUT PROLENE 0 SH 30 (SUTURE) ×1 IMPLANT
SUT SILK 0 FSL (SUTURE) ×1 IMPLANT
SYSTEM BALLN DILATION 12X40 (BALLOONS) IMPLANT
SYSTEM BALLN DILATION 14X40 (BALLOONS) IMPLANT
SYSTEM BALLN DILATION 16X40 (BALLOONS) IMPLANT
TOWEL OR 17X24 6PK STRL BLUE (TOWEL DISPOSABLE) ×3 IMPLANT
TUBE CONNECTING 12X1/4 (SUCTIONS) ×2 IMPLANT
WATER STERILE IRR 1000ML POUR (IV SOLUTION) ×2 IMPLANT

## 2018-11-18 NOTE — Transfer of Care (Signed)
Immediate Anesthesia Transfer of Care Note  Patient: Karen Wolfe  Procedure(s) Performed: MICROLARYNGOSCOPY WITH CO2 DILATION, MITOMYCIN c AND JET VENTILATION (N/A )  Patient Location: PACU  Anesthesia Type:General  Level of Consciousness: awake, alert  and oriented  Airway & Oxygen Therapy: Patient Spontanous Breathing  Post-op Assessment: Report given to RN  Post vital signs: Reviewed and stable  Last Vitals:  Vitals Value Taken Time  BP 191/174 11/18/2018 10:38 AM  Temp 36.3 C 11/18/2018 10:37 AM  Pulse 128 11/18/2018 10:40 AM  Resp 18 11/18/2018 10:40 AM  SpO2 91 % 11/18/2018 10:40 AM  Vitals shown include unvalidated device data.  Last Pain:  Vitals:   11/18/18 1037  TempSrc:   PainSc: (P) 0-No pain      Patients Stated Pain Goal: 4 (11/18/18 0654)  Complications: No apparent anesthesia complications

## 2018-11-18 NOTE — H&P (Signed)
Karen Wolfe is an 43 y.o. female.   Chief Complaint: Subglottic stenosis HPI: 43 year old female with subglottic stenosis underwent dilation in October 2016 presents with recurrence of difficulty breathing.  Past Medical History:  Diagnosis Date  . Anemia    during pregnancy only  . Anxiety   . Asthma   . Depression   . GERD (gastroesophageal reflux disease)    ? silent reflux  . History of kidney stones   . Pneumonia    " walking PNA"  . PONV (postoperative nausea and vomiting)   . Shortness of breath dyspnea    with exertion- upper airway stricture  . Sleep apnea    wears CPAP  . Subglottic stenosis   . Vaginal delivery    epidural x2     Past Surgical History:  Procedure Laterality Date  . DILATATION & CURETTAGE/HYSTEROSCOPY WITH MYOSURE N/A 11/22/2016   Procedure: DILATATION & CURETTAGE/HYSTEROSCOPY WITH MYOSURE;  Surgeon: Maxie BetterSheronette Cousins, MD;  Location: [facility];  Service: Gynecology;  Laterality: N/A;  . MICROLARYNGOSCOPY WITH DILATION N/A 08/09/2015   Procedure: MICROLARYNGOSCOPY WITH DILATION;  Surgeon: Christia Readingwight Aadarsh Cozort, MD;  Location: [facility] OR;  Service: ENT;  Laterality: N/A;  MICRO DIRECT LARYNGOSCOPY WITH CO2 LASER DILATION/JET VENTILATION  . VARICOSE VEIN SURGERY    . WISDOM TOOTH EXTRACTION      Family History  Problem Relation Age of Onset  . Diabetes Mother   . Hypertension Mother   . Graves' disease Mother   . Diabetes Father   . Hypertension Father    Social History:  reports that she has never smoked. She has never used smokeless tobacco. She reports that she does not drink alcohol or use drugs.  Allergies:  Allergies  Allergen Reactions  . Mold Extract [Trichophyton] Shortness Of Breath  . Shellfish Allergy Anaphylaxis, Shortness Of Breath and Nausea And Vomiting  . Fentanyl Nausea And Vomiting    Medications Prior to Admission  Medication Sig Dispense Refill  . omeprazole (PRILOSEC) 20 MG capsule Take 20 mg by mouth daily.    . sertraline  (ZOLOFT) 50 MG tablet Take 50 mg by mouth daily.    Marland Kitchen. acetaminophen (TYLENOL) 325 MG tablet Take 650 mg by mouth every 6 (six) hours as needed for moderate pain or headache.    . albuterol (PROVENTIL HFA;VENTOLIN HFA) 108 (90 Base) MCG/ACT inhaler Inhale 1-2 puffs into the lungs every 6 (six) hours as needed for wheezing or shortness of breath.    . cetirizine (ZYRTEC) 10 MG tablet Take 10 mg by mouth daily as needed for allergies.    . fluticasone (FLONASE) 50 MCG/ACT nasal spray Place 1 spray into both nostrils daily as needed for allergies or rhinitis.    Marland Kitchen. ibuprofen (ADVIL,MOTRIN) 200 MG tablet Take 400 mg by mouth every 8 (eight) hours as needed (pain).    Marland Kitchen. loratadine (CLARITIN) 10 MG tablet Take 10 mg by mouth daily as needed for allergies.       Results for orders placed or performed during the hospital encounter of 11/18/18 (from the past 48 hour(s))  Pregnancy, urine POC     Status: None   Collection Time: 11/18/18  6:45 AM  Result Value Ref Range   Preg Test, Ur NEGATIVE NEGATIVE    Comment:        THE SENSITIVITY OF THIS METHODOLOGY IS >24 mIU/mL   CBC     Status: Abnormal   Collection Time: 11/18/18  6:58 AM  Result Value Ref Range  WBC 8.2 4.0 - 10.5 K/uL   RBC 4.68 3.87 - 5.11 MIL/uL   Hemoglobin 12.6 12.0 - 15.0 g/dL   HCT 40.940.3 81.136.0 - 91.446.0 %   MCV 86.1 80.0 - 100.0 fL   MCH 26.9 26.0 - 34.0 pg   MCHC 31.3 30.0 - 36.0 g/dL   RDW 78.213.8 95.611.5 - 21.315.5 %   Platelets 417 (H) 150 - 400 K/uL   nRBC 0.0 0.0 - 0.2 %    Comment: Performed at [facility], [address] [place], [place] [address]   No results found.  Review of Systems  All other systems reviewed and are negative.   Blood pressure 128/80, pulse 88, temperature 98.3 F (36.8 C), temperature source Oral, resp. rate 20, height 5\' 3"  (1.6 m), weight 86.2 kg, last menstrual period 10/17/2018, SpO2 100 %. Physical Exam  Constitutional: She is oriented to person, place, and time. She appears well-developed  and well-nourished. No distress.  HENT:  Head: Normocephalic and atraumatic.  Right Ear: External ear normal.  Left Ear: External ear normal.  Nose: Nose normal.  Mouth/Throat: Oropharynx is clear and moist.  Eyes: Pupils are equal, round, and reactive to light. Conjunctivae and EOM are normal.  Neck: Normal range of motion. Neck supple.  Cardiovascular: Normal rate.  Respiratory: Effort normal.  Neurological: She is alert and oriented to person, place, and time. No cranial nerve deficit.  Skin: Skin is warm and dry.  Psychiatric: She has a normal mood and affect. Her behavior is normal. Judgment and thought content normal.     Assessment/Plan Subglottic stenosis   To OR for SMDL with CO2 laser dilation and mitomycin C.  Christia ReadingBATES, Drayson Dorko, MD 11/18/2018, 8:56 AM

## 2018-11-18 NOTE — Brief Op Note (Signed)
11/18/2018  10:25 AM  PATIENT:  Karen Wolfe  43 y.o. female  PRE-OPERATIVE DIAGNOSIS:  subglotic stenosis  POST-OPERATIVE DIAGNOSIS:  same  PROCEDURE:  Procedure(s): MICROLARYNGOSCOPY WITH CO2 DILATION, MITOMYCIN c AND JET VENTILATION (N/A)  SURGEON:  Surgeon(s) and Role:    Christia Reading, MD - Primary  PHYSICIAN ASSISTANT:   ASSISTANTS: none   ANESTHESIA:   general  EBL:  Minimal  BLOOD ADMINISTERED:none  DRAINS: none   LOCAL MEDICATIONS USED:  NONE  SPECIMEN:  No Specimen  DISPOSITION OF SPECIMEN:  N/A  COUNTS:  YES  TOURNIQUET:  * No tourniquets in log *  DICTATION: .Other Dictation: Dictation Number [id number]  PLAN OF CARE: Discharge to home after PACU  PATIENT DISPOSITION:  PACU - hemodynamically stable.   Delay start of Pharmacological VTE agent (>24hrs) due to surgical blood loss or risk of bleeding: no

## 2018-11-18 NOTE — Anesthesia Postprocedure Evaluation (Signed)
Anesthesia Post Note  Patient: Karen Wolfe  Procedure(s) Performed: MICROLARYNGOSCOPY WITH CO2 DILATION, MITOMYCIN c AND JET VENTILATION (N/A )     Patient location during evaluation: PACU Anesthesia Type: General Level of consciousness: awake and alert Pain management: pain level controlled Vital Signs Assessment: post-procedure vital signs reviewed and stable Respiratory status: spontaneous breathing, nonlabored ventilation, respiratory function stable and patient connected to nasal cannula oxygen Cardiovascular status: blood pressure returned to baseline and stable Postop Assessment: no apparent nausea or vomiting Anesthetic complications: no    Last Vitals:  Vitals:   11/18/18 1108 11/18/18 1119  BP: 125/62 136/72  Pulse: 90 92  Resp: 15 16  Temp:    SpO2: 94% 97%    Last Pain:  Vitals:   11/18/18 1105  TempSrc:   PainSc: 2                  Clemence Stillings COKER

## 2018-11-18 NOTE — Op Note (Signed)
NAME: Karen Wolfe, [place]ELSTE[place] MEDICAL RECORD [id number] ACCOUNT [id number] DATE OF BIRTH:1976/05/31 FACILITY: [facility] LOCATION: [facility] PHYSICIAN:Pia Jedlicka DJenne Pane, MD  OPERATIVE REPORT  DATE OF PROCEDURE:  11/18/2018  PREOPERATIVE DIAGNOSIS:  Subglottic stenosis.  POSTOPERATIVE DIAGNOSIS:  Subglottic stenosis.  PROCEDURE:  Suspended microdirect laryngoscopy with CO2 laser dilation and application of mitomycin C.  SURGEON:  Christia Reading, MD  ANESTHESIA:  General jet Venturi ventilation.  COMPLICATIONS:  None.  INDICATIONS:  The patient is a 43 year old female with subglottic stenosis who last underwent dilation in 2016 but has had recurrence of obstructive symptoms.  In recent months.  She presents to the operating room for surgical management.  FINDINGS:  There was a stenosis of the subglottic airway to about 60% stenosis.  This was a circumferential stenosis.  DESCRIPTION OF PROCEDURE:  The patient was identified in the holding room, informed consent having been obtained including discussion of risks, benefits and alternatives, the patient was brought to the operative suite and put the operative table in  supine position.  Anesthesia was induced and the patient was maintained via mask ventilation.  The patient was given intravenous steroids during the case.  The eyes were taped closed and the bed was turned 90 degrees from anesthesia.  A tooth guard was  placed over the upper teeth and a Stortz laryngoscope was used and inserted into the mouth and placed into the supraglottic position.  Packs were placed in the glottic position and suspended to the Mayo stand using the Lewy arm.  Jet ventilation was then  initiated.  Damp eye pads were taped over the eyes and a damp towel was placed over the face.  A 0 degree telescope was used to make a preoperative photograph.  An epinephrine-soaked pledget was held against the stenotic area.  The CO2 laser was then  used on a setting of 8 watts,  continuous to make radial cuts at 12 o'clock, 3 o'clock, 6 o'clock and 9 o'clock.   The 12 o'clock and 6 o'clock incisions were deeper cuts.  After this was completed, the largest tracheal balloon was then inserted and  inflated to 7 atmospheres of pressure for 60 seconds and deflated thereafter.  After some period of ventilation, the dilation was repeated.  After this, a segment of suction tubing was a pledget suture around it was soaked in mitomycin C, 0.4 mg/mL, for  0.5 mL.  This pledget with tubing was held in place in the subglottic airway for 3 minutes while ventilating through the tubing.  The tubing and pledgets were then removed and the airway was suctioned.  A postoperative photograph was made.  The  laryngoscope was taken out of suspension and removed from the patient's mouth while suctioning the airway.    She was then returned to anesthesia for wakeup under mask and mask ventilation and was admitted to the recovery room in stable condition.  AN/NUANCE  D:11/18/2018 T:11/18/2018 JOB:[id number]

## 2018-11-18 NOTE — Anesthesia Preprocedure Evaluation (Addendum)
Anesthesia Evaluation  Patient identified by MRN, date of birth, ID band Patient awake    Reviewed: Allergy & Precautions, NPO status , Patient's Chart, lab work & pertinent test results  Airway Mallampati: II  TM Distance: >3 FB Neck ROM: Full    Dental  (+) Teeth Intact, Dental Advisory Given   Pulmonary   Mild inspiratory stridor  breath sounds clear to auscultation  + stridor     Cardiovascular  Rhythm:Regular Rate:Normal     Neuro/Psych    GI/Hepatic   Endo/Other    Renal/GU      Musculoskeletal   Abdominal   Peds  Hematology   Anesthesia Other Findings   Reproductive/Obstetrics                          Anesthesia Physical Anesthesia Plan  ASA: III  Anesthesia Plan: General   Post-op Pain Management:    Induction: Intravenous  PONV Risk Score and Plan:   Airway Management Planned: Mask and Oral ETT  Additional Equipment:   Intra-op Plan:   Post-operative Plan:   Informed Consent: I have reviewed the patients History and Physical, chart, labs and discussed the procedure including the risks, benefits and alternatives for the proposed anesthesia with the patient or authorized representative who has indicated his/her understanding and acceptance.   Dental advisory given  Plan Discussed with: CRNA and Anesthesiologist  Anesthesia Plan Comments:         Anesthesia Quick Evaluation

## 2018-11-19 ENCOUNTER — Encounter (HOSPITAL_COMMUNITY): Payer: Self-pay | Admitting: Otolaryngology

## 2018-11-26 DIAGNOSIS — J386 Stenosis of larynx: Secondary | ICD-10-CM | POA: Diagnosis not present

## 2019-09-19 DIAGNOSIS — J386 Stenosis of larynx: Secondary | ICD-10-CM | POA: Diagnosis not present

## 2019-09-23 ENCOUNTER — Other Ambulatory Visit: Payer: Self-pay | Admitting: Otolaryngology

## 2019-09-25 NOTE — Pre-Procedure Instructions (Signed)
[facility] [place], [place] - [address] [place] [address] Phone: [phone] Fax: [phone]      Your procedure is scheduled on  09-30-19  Report to [facility] Main Entrance "A" at 0930 A.M., and check in at the Admitting office.  Call this number if you have problems the morning of surgery:  [phone]  Call [phone] if you have any questions prior to your surgery date Monday-Friday 8am-4pm    Remember:  Do not eat or drink after midnight the night before your surgery  Take these medicines the morning of surgery with A SIP OF WATER: sertraline (ZOLOFT) omeprazole (PRILOSEC)  acetaminophen (TYLENOL)as needed cetirizine (ZYRTEC) as needed levalbuterol (XOPENEX HFA)as needed loratadine (CLARITIN)as needed   7 days prior to surgery STOP taking any Aspirin (unless otherwise instructed by your surgeon), Aleve, Naproxen, Ibuprofen, Motrin, Advil, Goody's, BC's, all herbal medications, fish oil, and all vitamins.    The Morning of Surgery  Do not wear jewelry, make-up or nail polish.  Do not wear lotions, powders, or perfumes/colognes, or deodorant  Do not shave 48 hours prior to surgery.    Do not bring valuables to the hospital.  [facility] is not responsible for any belongings or valuables.  If you are a smoker, DO NOT Smoke 24 hours prior to surgery  If you wear a CPAP at night please bring your mask, tubing, and machine the morning of surgery   Remember that you must have someone to transport you home after your surgery, and remain with you for 24 hours if you are discharged the same day.   Please bring cases for contacts, glasses, hearing aids, dentures or bridgework because it cannot be worn into surgery.    Leave your suitcase in the car.  After surgery it may be brought to your room.  For patients admitted to the hospital, discharge time will be determined by your  treatment team.  Patients discharged the day of surgery will not be allowed to drive home.    Special instructions:   [facility]- Preparing For Surgery  Before surgery, you can play an important role. Because skin is not sterile, your skin needs to be as free of germs as possible. You can reduce the number of germs on your skin by washing with CHG (chlorahexidine gluconate) Soap before surgery.  CHG is an antiseptic cleaner which kills germs and bonds with the skin to continue killing germs even after washing.    Oral Hygiene is also important to reduce your risk of infection.  Remember - BRUSH YOUR TEETH THE MORNING OF SURGERY WITH YOUR REGULAR TOOTHPASTE  Please do not use if you have an allergy to CHG or antibacterial soaps. If your skin becomes reddened/irritated stop using the CHG.  Do not shave (including legs and underarms) for at least 48 hours prior to first CHG shower. It is OK to shave your face.  Please follow these instructions carefully.   1. Shower the NIGHT BEFORE SURGERY and the MORNING OF SURGERY with CHG Soap.   2. If you chose to wash your hair, wash your hair first as usual with your normal shampoo.  3. After you shampoo, rinse your hair and body thoroughly to remove the shampoo.  4. Use CHG as you would any other liquid soap. You can apply CHG directly to the skin and wash gently with a scrungie or a  clean washcloth.   5. Apply the CHG Soap to your body ONLY FROM THE NECK DOWN.  Do not use on open wounds or open sores. Avoid contact with your eyes, ears, mouth and genitals (private parts). Wash Face and genitals (private parts)  with your normal soap.   6. Wash thoroughly, paying special attention to the area where your surgery will be performed.  7. Thoroughly rinse your body with warm water from the neck down.  8. DO NOT shower/wash with your normal soap after using and rinsing off the CHG Soap.  9. Pat yourself dry with a CLEAN TOWEL.  10. Wear CLEAN  PAJAMAS to bed the night before surgery, wear comfortable clothes the morning of surgery  11. Place CLEAN SHEETS on your bed the night of your first shower and DO NOT SLEEP WITH PETS.    Day of Surgery:  Please shower the morning of surgery with the CHG soap Do not apply any deodorants/lotions. Please wear clean clothes to the hospital/surgery center.   Remember to brush your teeth WITH YOUR REGULAR TOOTHPASTE.   Please read over the  fact sheets that you were given.

## 2019-09-26 ENCOUNTER — Encounter (HOSPITAL_COMMUNITY)
Admission: RE | Admit: 2019-09-26 | Discharge: 2019-09-26 | Disposition: A | Discharge status: Home / Self Care | Payer: BC Managed Care – PPO | Source: Ambulatory Visit | Attending: Otolaryngology | Admitting: Otolaryngology

## 2019-09-26 ENCOUNTER — Other Ambulatory Visit (HOSPITAL_COMMUNITY)
Admission: RE | Admit: 2019-09-26 | Discharge: 2019-09-26 | Disposition: A | Discharge status: Home / Self Care | Payer: BC Managed Care – PPO | Source: Ambulatory Visit | Attending: Otolaryngology | Admitting: Otolaryngology

## 2019-09-26 ENCOUNTER — Other Ambulatory Visit: Payer: Self-pay

## 2019-09-26 ENCOUNTER — Encounter (HOSPITAL_COMMUNITY): Payer: Self-pay

## 2019-09-26 DIAGNOSIS — Z01812 Encounter for preprocedural laboratory examination: Secondary | ICD-10-CM | POA: Insufficient documentation

## 2019-09-26 DIAGNOSIS — Q311 Congenital subglottic stenosis: Secondary | ICD-10-CM | POA: Diagnosis not present

## 2019-09-26 LAB — CBC
HCT: 36.1 % (ref 36.0–46.0)
Hemoglobin: 11 g/dL — ABNORMAL LOW (ref 12.0–15.0)
MCH: 26.5 pg (ref 26.0–34.0)
MCHC: 30.5 g/dL (ref 30.0–36.0)
MCV: 87 fL (ref 80.0–100.0)
Platelets: 380 10*3/uL (ref 150–400)
RBC: 4.15 MIL/uL (ref 3.87–5.11)
RDW: 14.3 % (ref 11.5–15.5)
WBC: 5.3 10*3/uL (ref 4.0–10.5)
nRBC: 0 % (ref 0.0–0.2)

## 2019-09-26 LAB — BASIC METABOLIC PANEL
Anion gap: 9 (ref 5–15)
BUN: 8 mg/dL (ref 6–20)
CO2: 25 mmol/L (ref 22–32)
Calcium: 8.9 mg/dL (ref 8.9–10.3)
Chloride: 105 mmol/L (ref 98–111)
Creatinine, Ser: 0.75 mg/dL (ref 0.44–1.00)
GFR calc Af Amer: >60 mL/min (ref >60)
GFR calc non Af Amer: >60 mL/min (ref >60)
Glucose, Bld: 129 mg/dL — ABNORMAL HIGH (ref 70–99)
Potassium: 3.6 mmol/L (ref 3.5–5.1)
Sodium: 139 mmol/L (ref 135–145)

## 2019-09-26 NOTE — Progress Notes (Signed)
Covid test scheduled for today. No h/o symptoms concerning for Covid.No h/o recent travel/. No h/o recent exposure to Covid positive patients.  PCP - Dr.  Stephanie Acre, Karen Wolfe  Cardiologist - denies  Chest x-ray - denies  EKG - denies  Stress Test - denies  ECHO - denies  Cardiac Cath - denies  AICD-denies PM-denies LOOP-denies  Sleep Study - Y CPAP - Y  LABS-CBC,BMP  ASA-denies  ERAS-NA  HA1C-denies Fasting Blood Sugar -  Checks Blood Sugar _____ times a day  Anesthesia-N  Pt denies having chest pain, sob, or fever at this time. All instructions explained to the pt, with a verbal understanding of the material. Pt agrees to go over the instructions while at home for a better understanding. Pt also instructed to self quarantine after being tested for COVID-19. The opportunity to ask questions was provided.

## 2019-09-28 LAB — NOVEL CORONAVIRUS, NAA (HOSP ORDER, SEND-OUT TO REF LAB; TAT 18-24 HRS): SARS-CoV-2, NAA: NOT DETECTED

## 2019-09-30 ENCOUNTER — Other Ambulatory Visit: Payer: Self-pay

## 2019-09-30 ENCOUNTER — Encounter (HOSPITAL_COMMUNITY)
Admission: RE | Disposition: A | Discharge status: Home / Self Care | Payer: Self-pay | Source: Home / Self Care | Attending: Otolaryngology

## 2019-09-30 ENCOUNTER — Ambulatory Visit (HOSPITAL_COMMUNITY): Payer: BC Managed Care – PPO | Admitting: Certified Registered"

## 2019-09-30 ENCOUNTER — Ambulatory Visit (HOSPITAL_COMMUNITY)
Admission: RE | Admit: 2019-09-30 | Discharge: 2019-09-30 | Disposition: A | Discharge status: Home / Self Care | Payer: BC Managed Care – PPO | Attending: Otolaryngology | Admitting: Otolaryngology

## 2019-09-30 ENCOUNTER — Encounter (HOSPITAL_COMMUNITY): Payer: Self-pay

## 2019-09-30 DIAGNOSIS — G4730 Sleep apnea, unspecified: Secondary | ICD-10-CM | POA: Insufficient documentation

## 2019-09-30 DIAGNOSIS — Z87442 Personal history of urinary calculi: Secondary | ICD-10-CM | POA: Diagnosis not present

## 2019-09-30 DIAGNOSIS — F329 Major depressive disorder, single episode, unspecified: Secondary | ICD-10-CM | POA: Insufficient documentation

## 2019-09-30 DIAGNOSIS — Z8249 Family history of ischemic heart disease and other diseases of the circulatory system: Secondary | ICD-10-CM | POA: Diagnosis not present

## 2019-09-30 DIAGNOSIS — J45909 Unspecified asthma, uncomplicated: Secondary | ICD-10-CM | POA: Insufficient documentation

## 2019-09-30 DIAGNOSIS — K219 Gastro-esophageal reflux disease without esophagitis: Secondary | ICD-10-CM | POA: Diagnosis not present

## 2019-09-30 DIAGNOSIS — Z79899 Other long term (current) drug therapy: Secondary | ICD-10-CM | POA: Insufficient documentation

## 2019-09-30 DIAGNOSIS — D649 Anemia, unspecified: Secondary | ICD-10-CM | POA: Diagnosis not present

## 2019-09-30 DIAGNOSIS — F419 Anxiety disorder, unspecified: Secondary | ICD-10-CM | POA: Insufficient documentation

## 2019-09-30 DIAGNOSIS — J386 Stenosis of larynx: Secondary | ICD-10-CM | POA: Insufficient documentation

## 2019-09-30 DIAGNOSIS — Z9989 Dependence on other enabling machines and devices: Secondary | ICD-10-CM | POA: Diagnosis not present

## 2019-09-30 HISTORY — PX: MICROLARYNGOSCOPY WITH DILATION: SHX5971

## 2019-09-30 LAB — POCT PREGNANCY, URINE: Preg Test, Ur: NEGATIVE

## 2019-09-30 SURGERY — MICROLARYNGOSCOPY WITH DILATION
Anesthesia: General

## 2019-09-30 MED ORDER — PROPOFOL 500 MG/50ML IV EMUL
INTRAVENOUS | Status: DC | PRN
  Administered 2019-09-30: 125 ug/kg/min via INTRAVENOUS

## 2019-09-30 MED ORDER — PHENYLEPHRINE 40 MCG/ML (10ML) SYRINGE FOR IV PUSH (FOR BLOOD PRESSURE SUPPORT)
PREFILLED_SYRINGE | INTRAVENOUS | Status: AC
  Filled 2019-09-30: qty 10

## 2019-09-30 MED ORDER — SUCCINYLCHOLINE CHLORIDE 200 MG/10ML IV SOSY
PREFILLED_SYRINGE | INTRAVENOUS | Status: DC | PRN
  Administered 2019-09-30: 100 mg via INTRAVENOUS

## 2019-09-30 MED ORDER — FENTANYL CITRATE (PF) 250 MCG/5ML IJ SOLN
INTRAMUSCULAR | Status: AC
  Filled 2019-09-30: qty 5

## 2019-09-30 MED ORDER — ACETAMINOPHEN 500 MG PO TABS
1000.0000 mg | ORAL_TABLET | Freq: Once | ORAL | Status: AC
  Administered 2019-09-30: 11:00:00 1000 mg via ORAL

## 2019-09-30 MED ORDER — MIDAZOLAM HCL 2 MG/2ML IJ SOLN
INTRAMUSCULAR | Status: AC
  Filled 2019-09-30: qty 2

## 2019-09-30 MED ORDER — METHYLPREDNISOLONE 4 MG PO TBPK: ORAL_TABLET | ORAL | 0 refills | Status: DC

## 2019-09-30 MED ORDER — PROPOFOL 10 MG/ML IV BOLUS
INTRAVENOUS | Status: DC | PRN
  Administered 2019-09-30: 120 mg via INTRAVENOUS

## 2019-09-30 MED ORDER — 0.9 % SODIUM CHLORIDE (POUR BTL) OPTIME
TOPICAL | Status: DC | PRN
  Administered 2019-09-30: 1000 mL

## 2019-09-30 MED ORDER — SODIUM CHLORIDE 0.9 % IV SOLN
0.0125 ug/kg/min | INTRAVENOUS | Status: AC
  Administered 2019-09-30: .4 ug/kg/min via INTRAVENOUS
  Filled 2019-09-30: qty 2000

## 2019-09-30 MED ORDER — ACETAMINOPHEN 500 MG PO TABS
ORAL_TABLET | ORAL | Status: AC
  Administered 2019-09-30: 11:00:00 1000 mg via ORAL
  Filled 2019-09-30: qty 2

## 2019-09-30 MED ORDER — MIDAZOLAM HCL 5 MG/5ML IJ SOLN
INTRAMUSCULAR | Status: DC | PRN
  Administered 2019-09-30: 2 mg via INTRAVENOUS

## 2019-09-30 MED ORDER — DEXAMETHASONE SODIUM PHOSPHATE 10 MG/ML IJ SOLN
INTRAMUSCULAR | Status: DC | PRN
  Administered 2019-09-30: 10 mg via INTRAVENOUS

## 2019-09-30 MED ORDER — PROPOFOL 10 MG/ML IV BOLUS
INTRAVENOUS | Status: AC
  Filled 2019-09-30: qty 20

## 2019-09-30 MED ORDER — LIDOCAINE 2% (20 MG/ML) 5 ML SYRINGE
INTRAMUSCULAR | Status: DC | PRN
  Administered 2019-09-30: 60 mg via INTRAVENOUS

## 2019-09-30 MED ORDER — LACTATED RINGERS IV SOLN
INTRAVENOUS | Status: DC
  Administered 2019-09-30: 10:00:00 via INTRAVENOUS

## 2019-09-30 MED ORDER — EPINEPHRINE PF 1 MG/ML IJ SOLN
INTRAMUSCULAR | Status: DC | PRN
  Administered 2019-09-30: 1 mg

## 2019-09-30 MED ORDER — EPINEPHRINE HCL (NASAL) 0.1 % NA SOLN
NASAL | Status: AC
  Filled 2019-09-30: qty 30

## 2019-09-30 MED ORDER — ONDANSETRON HCL 4 MG/2ML IJ SOLN
INTRAMUSCULAR | Status: DC | PRN
  Administered 2019-09-30: 4 mg via INTRAVENOUS

## 2019-09-30 MED ORDER — PHENYLEPHRINE HCL-NACL 10-0.9 MG/250ML-% IV SOLN
INTRAVENOUS | Status: DC | PRN
  Administered 2019-09-30: 25 ug/min via INTRAVENOUS

## 2019-09-30 SURGICAL SUPPLY — 35 items
BALLN PULM 12 13.5 15X75 (BALLOONS) ×2
BALLN PULM 15 16.5 18X75 (BALLOONS)
BALLN PULMONARY 10-12 (MISCELLANEOUS) IMPLANT
BALLN PULMONARY 8-10 OD75 (MISCELLANEOUS) IMPLANT
BALLOON PULM 12 13.5 15X75 (BALLOONS) IMPLANT
BALLOON PULM 15 16.5 18X75 (BALLOONS) IMPLANT
BNDG EYE OVAL (GAUZE/BANDAGES/DRESSINGS) ×2 IMPLANT
CANISTER SUCT 3000ML PPV (MISCELLANEOUS) ×2 IMPLANT
CONT SPEC 4OZ CLIKSEAL STRL BL (MISCELLANEOUS) IMPLANT
COVER BACK TABLE 60X90IN (DRAPES) ×2 IMPLANT
COVER WAND RF STERILE (DRAPES) ×1 IMPLANT
DRAPE HALF SHEET 40X57 (DRAPES) ×2 IMPLANT
GAUZE SPONGE 4X4 12PLY STRL (GAUZE/BANDAGES/DRESSINGS) ×2 IMPLANT
GLOVE BIO SURGEON STRL SZ7.5 (GLOVE) ×2 IMPLANT
GOWN STRL REUS W/ TWL LRG LVL3 (GOWN DISPOSABLE) IMPLANT
GOWN STRL REUS W/TWL LRG LVL3 (GOWN DISPOSABLE)
KIT BASIN OR (CUSTOM PROCEDURE TRAY) ×2 IMPLANT
KIT TURNOVER KIT B (KITS) ×2 IMPLANT
NDL HYPO 25GX1X1/2 BEV (NEEDLE) IMPLANT
NEEDLE HYPO 25GX1X1/2 BEV (NEEDLE) IMPLANT
NS IRRIG 1000ML POUR BTL (IV SOLUTION) ×2 IMPLANT
PAD ARMBOARD 7.5X6 YLW CONV (MISCELLANEOUS) ×4 IMPLANT
PATTIES SURGICAL .5 X1 (DISPOSABLE) ×1 IMPLANT
PATTIES SURGICAL .5 X3 (DISPOSABLE) IMPLANT
POSITIONER HEAD DONUT 9IN (MISCELLANEOUS) IMPLANT
SET COLLECT BLD 25X3/4 12 (NEEDLE) IMPLANT
SOL ANTI FOG 6CC (MISCELLANEOUS) IMPLANT
SOLUTION ANTI FOG 6CC (MISCELLANEOUS) ×1
SURGILUBE 2OZ TUBE FLIPTOP (MISCELLANEOUS) IMPLANT
SUT SILK 2 0 SH (SUTURE) IMPLANT
SYR CONTROL 10ML LL (SYRINGE) ×2 IMPLANT
SYR GAUGE ALLIANCE SINGLE USE (MISCELLANEOUS) IMPLANT
TOWEL GREEN STERILE FF (TOWEL DISPOSABLE) ×4 IMPLANT
TUBE CONNECTING 12X1/4 (SUCTIONS) ×2 IMPLANT
WATER STERILE IRR 1000ML POUR (IV SOLUTION) ×2 IMPLANT

## 2019-09-30 NOTE — H&P (Signed)
Karen Wolfe is an 43 y.o. female.   Chief Complaint: Subglottic stenosis HPI: 43 year old female with idiopathic subglottic stenosis who required dilation in 2016 and again in January 2020.  She has had return of inspiratory stridor and difficulty breathing in recent weeks and presents for repeat dilation.  We have elected to avoid mitomycin this time since it was used in January.  Past Medical History:  Diagnosis Date  . Anemia    during pregnancy only  . Anxiety   . Asthma   . Depression   . GERD (gastroesophageal reflux disease)    ? silent reflux  . History of kidney stones   . Pneumonia    " walking PNA"  . PONV (postoperative nausea and vomiting)   . Shortness of breath dyspnea    with exertion- upper airway stricture  . Sleep apnea    wears CPAP  . Subglottic stenosis   . Vaginal delivery    epidural x2     Past Surgical History:  Procedure Laterality Date  . DILATATION & CURETTAGE/HYSTEROSCOPY WITH MYOSURE N/A 11/22/2016   Procedure: DILATATION & CURETTAGE/HYSTEROSCOPY WITH MYOSURE;  Surgeon: Maxie Better, MD;  Location: [facility];  Service: Gynecology;  Laterality: N/A;  . MICROLARYNGOSCOPY WITH DILATION N/A 08/09/2015   Procedure: MICROLARYNGOSCOPY WITH DILATION;  Surgeon: Christia Reading, MD;  Location: [facility] OR;  Service: ENT;  Laterality: N/A;  MICRO DIRECT LARYNGOSCOPY WITH CO2 LASER DILATION/JET VENTILATION  . MICROLARYNGOSCOPY WITH DILATION N/A 11/18/2018   Procedure: MICROLARYNGOSCOPY WITH CO2 DILATION, MITOMYCIN c AND JET VENTILATION;  Surgeon: Christia Reading, MD;  Location: [facility] OR;  Service: ENT;  Laterality: N/A;  . VARICOSE VEIN SURGERY    . WISDOM TOOTH EXTRACTION      Family History  Problem Relation Age of Onset  . Diabetes Mother   . Hypertension Mother   . Graves' disease Mother   . Diabetes Father   . Hypertension Father    Social History:  reports that she has never smoked. She has never used smokeless tobacco. She reports that she does not drink  alcohol or use drugs.  Allergies:  Allergies  Allergen Reactions  . Mold Extract [Trichophyton] Shortness Of Breath  . Shellfish Allergy Anaphylaxis, Shortness Of Breath and Nausea And Vomiting  . Fentanyl Nausea And Vomiting    Medications Prior to Admission  Medication Sig Dispense Refill  . acetaminophen (TYLENOL) 325 MG tablet Take 650 mg by mouth every 6 (six) hours as needed for moderate pain or headache.    . cetirizine (ZYRTEC) 10 MG tablet Take 10 mg by mouth daily as needed for allergies.    Marland Kitchen levalbuterol (XOPENEX HFA) 45 MCG/ACT inhaler Inhale 1 puff into the lungs every 4 (four) hours as needed for wheezing or shortness of breath.    Marland Kitchen omeprazole (PRILOSEC) 40 MG capsule Take 40 mg by mouth daily.    . sertraline (ZOLOFT) 50 MG tablet Take 50 mg by mouth daily.    Marland Kitchen loratadine (CLARITIN) 10 MG tablet Take 10 mg by mouth daily as needed for allergies.       Results for orders placed or performed during the hospital encounter of 09/30/19 (from the past 48 hour(s))  Pregnancy, urine POC     Status: None   Collection Time: 09/30/19 10:31 AM  Result Value Ref Range   Preg Test, Ur NEGATIVE NEGATIVE    Comment:        THE SENSITIVITY OF THIS METHODOLOGY IS >24 mIU/mL  No results found.  Review of Systems  Respiratory: Positive for shortness of breath.   Neurological: Positive for headaches.  All other systems reviewed and are negative.   Blood pressure (!) 120/57, pulse 78, temperature 98.5 F (36.9 C), temperature source Oral, resp. rate 18, height 5\' 3"  (1.6 m), last menstrual period 09/24/2019, SpO2 100 %. Physical Exam  Constitutional: She is oriented to person, place, and time. She appears well-developed and well-nourished. No distress.  HENT:  Head: Normocephalic and atraumatic.  Right Ear: External ear normal.  Left Ear: External ear normal.  Nose: Nose normal.  Mouth/Throat: Oropharynx is clear and moist.  Eyes: Pupils are equal, round, and reactive to  light. Conjunctivae and EOM are normal.  Neck: Normal range of motion. Neck supple.  Cardiovascular: Normal rate.  Respiratory: Effort normal.  Soft inspiratory stridor.  Neurological: She is alert and oriented to person, place, and time. No cranial nerve deficit.  Skin: Skin is warm and dry.  Psychiatric: She has a normal mood and affect. Her behavior is normal. Judgment and thought content normal.     Assessment/Plan Subglottic stenosis To OR for SMDL with CO2 laser dilation, no mitomycin.  Melida Quitter, MD 09/30/2019, 11:53 AM

## 2019-09-30 NOTE — Brief Op Note (Signed)
09/30/2019  12:31 PM  PATIENT:  Karen Wolfe  43 y.o. female  PRE-OPERATIVE DIAGNOSIS:  Subglottic stenosis  POST-OPERATIVE DIAGNOSIS:  Subglottic stenosis  PROCEDURE:  Procedure(s): MICROLARYNGOSCOPY WITH CO2 LASER BALLOON DILATION (N/A)  SURGEON:  Surgeon(s) and Role:    * Melida Quitter, MD - Primary  PHYSICIAN ASSISTANT:   ASSISTANTS: none   ANESTHESIA:   general  EBL: Minimal  BLOOD ADMINISTERED:none  DRAINS: none   LOCAL MEDICATIONS USED:  NONE  SPECIMEN:  No Specimen  DISPOSITION OF SPECIMEN:  N/A  COUNTS:  YES  TOURNIQUET:  * No tourniquets in log *  DICTATION: .Other Dictation: Dictation Number [id number]  PLAN OF CARE: Discharge to home after PACU  PATIENT DISPOSITION:  PACU - hemodynamically stable.   Delay start of Pharmacological VTE agent (>24hrs) due to surgical blood loss or risk of bleeding: no

## 2019-09-30 NOTE — Anesthesia Preprocedure Evaluation (Signed)
Anesthesia Evaluation  Patient identified by MRN, date of birth, ID band Patient awake    Reviewed: Allergy & Precautions, H&P , NPO status , Patient's Chart, lab work & pertinent test results  History of Anesthesia Complications (+) PONV  Airway Mallampati: III  TM Distance: >3 FB Neck ROM: Full    Dental no notable dental hx. (+) Teeth Intact, Dental Advisory Given   Pulmonary shortness of breath, asthma , sleep apnea and Continuous Positive Airway Pressure Ventilation ,    Pulmonary exam normal breath sounds clear to auscultation       Cardiovascular negative cardio ROS   Rhythm:Regular Rate:Normal     Neuro/Psych Anxiety Depression negative neurological ROS     GI/Hepatic Neg liver ROS, GERD  Medicated and Controlled,  Endo/Other  negative endocrine ROS  Renal/GU negative Renal ROS  negative genitourinary   Musculoskeletal   Abdominal   Peds  Hematology  (+) Blood dyscrasia, anemia ,   Anesthesia Other Findings   Reproductive/Obstetrics negative OB ROS                             Anesthesia Physical Anesthesia Plan  ASA: III  Anesthesia Plan: General   Post-op Pain Management:    Induction: Intravenous  PONV Risk Score and Plan: 4 or greater and Midazolam, Ondansetron and Dexamethasone  Airway Management Planned: Natural Airway  Additional Equipment:   Intra-op Plan:   Post-operative Plan:   Informed Consent: I have reviewed the patients History and Physical, chart, labs and discussed the procedure including the risks, benefits and alternatives for the proposed anesthesia with the patient or authorized representative who has indicated his/her understanding and acceptance.     Dental advisory given  Plan Discussed with: CRNA and Surgeon  Anesthesia Plan Comments:         Anesthesia Quick Evaluation

## 2019-09-30 NOTE — Op Note (Signed)
NAME: Karen Wolfe, Karen Wolfe MEDICAL RECORD [id number] ACCOUNT [id number] DATE OF BIRTH:[birth year] FACILITY: [facility] LOCATION: [facility] PHYSICIAN:Shalona Harbour DRedmond Baseman, MD  OPERATIVE REPORT  DATE OF PROCEDURE:  09/30/2019  PREOPERATIVE DIAGNOSIS:  Subglottic stenosis.  POSTOPERATIVE DIAGNOSIS:  Subglottic stenosis.  PROCEDURE:  Microlaryngoscopy with CO2 laser dilation.  SURGEON:  Melida Quitter, MD  ANESTHESIA:  General jet Venturi ventilation.  COMPLICATIONS:  None.  INDICATIONS:  The patient is a 43 year old female with idiopathic subglottic stenosis who has required dilation in 2016 and again in January of this year.  She has had return of restrictive breathing symptoms and presents back to the operating room for  surgical management.  FINDINGS:  There was 50% stenosis of the subglottic airway in a circumferential pattern.  DESCRIPTION OF PROCEDURE:  The patient was identified in the holding room, informed consent having been obtained, including risks including discussion of risks, benefits and alternatives, the patient was brought to the operative suite and table in supine  position.  Anesthesia was induced and the patient was maintained via mask ventilation.  The eyes were taped closed and the bed was turned 90 degrees from anesthesia.  The patient was given intravenous Decadron during the case.  A tooth guard was placed  over the upper teeth and damp eye pads were taped over the eyes.  A Stortz laryngoscope was then inserted and placed in the glottic position and then suspended to the Mayo stand using the Lewy arm.  Jet ventilation was then initiated.  A damp towel was  placed over the face and the 0-degree telescope was brought into the field and used to make a preoperative photograph.  The operating microscope was then brought into the field and the CO2 laser was used on a setting of 8 watts to make radial cuts at 4  quadrants at about 1:30, 4:30, 7:30 and 10:30.  After these were made,  the largest tracheal balloon was then inserted and inflated to 7 atmospheres of pressure for 1 minute and then deflated.  The patient was ventilated and then the dilation was  repeated.  After this was completed, the airway was suctioned and the laryngoscope was taken out of suspension and gradually backed out.  The larynx was sprayed with topical lidocaine.  The laryngoscope was taken out of the patient's mouth suctioning the  airway.  She was then returned to anesthesia for wakeup and was moved to recovery room in stable condition.  TN/NUANCE  D:09/30/2019 T:09/30/2019 JOB:[id number]

## 2019-09-30 NOTE — Anesthesia Postprocedure Evaluation (Signed)
Anesthesia Post Note  Patient: CHARONDA HEFTER  Procedure(s) Performed: MICROLARYNGOSCOPY WITH CO2 LASER BALLOON DILATION (N/A )     Patient location during evaluation: PACU Anesthesia Type: General Level of consciousness: awake and alert Pain management: pain level controlled Vital Signs Assessment: post-procedure vital signs reviewed and stable Respiratory status: spontaneous breathing, nonlabored ventilation and respiratory function stable Cardiovascular status: blood pressure returned to baseline and stable Postop Assessment: no apparent nausea or vomiting Anesthetic complications: no    Last Vitals:  Vitals:   09/30/19 1254 09/30/19 1308  BP: 116/66 108/83  Pulse: 83 82  Resp: 20 16  Temp:  36.6 C  SpO2: 98% 100%    Last Pain:  Vitals:   09/30/19 1308  TempSrc:   PainSc: 0-No pain                 Kennisha Qin,W. EDMOND

## 2019-09-30 NOTE — Transfer of Care (Signed)
Immediate Anesthesia Transfer of Care Note  Patient: Karen Wolfe  Procedure(s) Performed: MICROLARYNGOSCOPY WITH CO2 LASER BALLOON DILATION (N/A )  Patient Location: PACU  Anesthesia Type:General  Level of Consciousness: awake, alert  and oriented  Airway & Oxygen Therapy: Patient Spontanous Breathing and Patient connected to face mask oxygen  Post-op Assessment: Report given to RN and Post -op Vital signs reviewed and stable  Post vital signs: Reviewed and stable  Last Vitals:  Vitals Value Taken Time  BP 151/84 09/30/19 1239  Temp 36.7 C 09/30/19 1241  Pulse 106 09/30/19 1243  Resp 23 09/30/19 1243  SpO2 100 % 09/30/19 1243  Vitals shown include unvalidated device data.  Last Pain:  Vitals:   09/30/19 1008  TempSrc:   PainSc: 0-No pain         Complications: No apparent anesthesia complications

## 2019-10-01 ENCOUNTER — Encounter (HOSPITAL_COMMUNITY): Payer: Self-pay | Admitting: Otolaryngology

## 2019-10-20 DIAGNOSIS — J386 Stenosis of larynx: Secondary | ICD-10-CM | POA: Diagnosis not present

## 2019-10-20 DIAGNOSIS — J45909 Unspecified asthma, uncomplicated: Secondary | ICD-10-CM | POA: Diagnosis not present

## 2020-01-28 DIAGNOSIS — J3801 Paralysis of vocal cords and larynx, unilateral: Secondary | ICD-10-CM | POA: Diagnosis not present

## 2020-02-12 DIAGNOSIS — J3801 Paralysis of vocal cords and larynx, unilateral: Secondary | ICD-10-CM | POA: Diagnosis not present

## 2020-02-12 DIAGNOSIS — R490 Dysphonia: Secondary | ICD-10-CM | POA: Diagnosis not present

## 2020-04-29 DIAGNOSIS — J3801 Paralysis of vocal cords and larynx, unilateral: Secondary | ICD-10-CM | POA: Diagnosis not present

## 2020-04-29 DIAGNOSIS — R490 Dysphonia: Secondary | ICD-10-CM | POA: Diagnosis not present

## 2020-06-04 DIAGNOSIS — Z20822 Contact with and (suspected) exposure to covid-19: Secondary | ICD-10-CM | POA: Diagnosis not present

## 2020-07-30 DIAGNOSIS — S76912A Strain of unspecified muscles, fascia and tendons at thigh level, left thigh, initial encounter: Secondary | ICD-10-CM | POA: Diagnosis not present

## 2020-07-30 DIAGNOSIS — M79652 Pain in left thigh: Secondary | ICD-10-CM | POA: Diagnosis not present

## 2020-07-30 DIAGNOSIS — Z888 Allergy status to other drugs, medicaments and biological substances status: Secondary | ICD-10-CM | POA: Diagnosis not present

## 2020-07-30 DIAGNOSIS — X58XXXA Exposure to other specified factors, initial encounter: Secondary | ICD-10-CM | POA: Diagnosis not present

## 2020-07-30 DIAGNOSIS — Z7951 Long term (current) use of inhaled steroids: Secondary | ICD-10-CM | POA: Diagnosis not present

## 2020-07-30 DIAGNOSIS — R52 Pain, unspecified: Secondary | ICD-10-CM | POA: Diagnosis not present

## 2020-07-30 DIAGNOSIS — G8911 Acute pain due to trauma: Secondary | ICD-10-CM | POA: Diagnosis not present

## 2020-07-30 DIAGNOSIS — Z79899 Other long term (current) drug therapy: Secondary | ICD-10-CM | POA: Diagnosis not present

## 2020-07-30 DIAGNOSIS — S8992XA Unspecified injury of left lower leg, initial encounter: Secondary | ICD-10-CM | POA: Diagnosis not present

## 2020-07-31 DIAGNOSIS — S76312A Strain of muscle, fascia and tendon of the posterior muscle group at thigh level, left thigh, initial encounter: Secondary | ICD-10-CM | POA: Diagnosis not present

## 2020-08-02 DIAGNOSIS — S76312D Strain of muscle, fascia and tendon of the posterior muscle group at thigh level, left thigh, subsequent encounter: Secondary | ICD-10-CM | POA: Diagnosis not present

## 2020-08-23 DIAGNOSIS — S76312D Strain of muscle, fascia and tendon of the posterior muscle group at thigh level, left thigh, subsequent encounter: Secondary | ICD-10-CM | POA: Diagnosis not present

## 2020-08-27 DIAGNOSIS — S76012D Strain of muscle, fascia and tendon of left hip, subsequent encounter: Secondary | ICD-10-CM | POA: Diagnosis not present

## 2020-08-27 DIAGNOSIS — M25552 Pain in left hip: Secondary | ICD-10-CM | POA: Diagnosis not present

## 2020-08-27 DIAGNOSIS — R269 Unspecified abnormalities of gait and mobility: Secondary | ICD-10-CM | POA: Diagnosis not present

## 2020-08-27 DIAGNOSIS — M6281 Muscle weakness (generalized): Secondary | ICD-10-CM | POA: Diagnosis not present

## 2020-09-01 DIAGNOSIS — M25552 Pain in left hip: Secondary | ICD-10-CM | POA: Diagnosis not present

## 2020-09-01 DIAGNOSIS — S76012D Strain of muscle, fascia and tendon of left hip, subsequent encounter: Secondary | ICD-10-CM | POA: Diagnosis not present

## 2020-09-01 DIAGNOSIS — M6281 Muscle weakness (generalized): Secondary | ICD-10-CM | POA: Diagnosis not present

## 2020-09-01 DIAGNOSIS — R269 Unspecified abnormalities of gait and mobility: Secondary | ICD-10-CM | POA: Diagnosis not present

## 2020-09-06 DIAGNOSIS — R269 Unspecified abnormalities of gait and mobility: Secondary | ICD-10-CM | POA: Diagnosis not present

## 2020-09-06 DIAGNOSIS — M6281 Muscle weakness (generalized): Secondary | ICD-10-CM | POA: Diagnosis not present

## 2020-09-06 DIAGNOSIS — S76012D Strain of muscle, fascia and tendon of left hip, subsequent encounter: Secondary | ICD-10-CM | POA: Diagnosis not present

## 2020-09-06 DIAGNOSIS — M25552 Pain in left hip: Secondary | ICD-10-CM | POA: Diagnosis not present

## 2020-09-08 DIAGNOSIS — M25552 Pain in left hip: Secondary | ICD-10-CM | POA: Diagnosis not present

## 2020-10-04 DIAGNOSIS — R509 Fever, unspecified: Secondary | ICD-10-CM | POA: Diagnosis not present

## 2020-10-04 DIAGNOSIS — U071 COVID-19: Secondary | ICD-10-CM | POA: Diagnosis not present

## 2020-10-04 DIAGNOSIS — R0981 Nasal congestion: Secondary | ICD-10-CM | POA: Diagnosis not present

## 2020-10-06 DIAGNOSIS — U071 COVID-19: Secondary | ICD-10-CM | POA: Diagnosis not present

## 2020-10-07 DIAGNOSIS — U071 COVID-19: Secondary | ICD-10-CM | POA: Diagnosis not present

## 2020-10-20 DIAGNOSIS — F411 Generalized anxiety disorder: Secondary | ICD-10-CM | POA: Diagnosis not present

## 2020-10-20 DIAGNOSIS — J45909 Unspecified asthma, uncomplicated: Secondary | ICD-10-CM | POA: Diagnosis not present

## 2020-12-09 DIAGNOSIS — N920 Excessive and frequent menstruation with regular cycle: Secondary | ICD-10-CM | POA: Diagnosis not present

## 2020-12-09 DIAGNOSIS — Z01419 Encounter for gynecological examination (general) (routine) without abnormal findings: Secondary | ICD-10-CM | POA: Diagnosis not present

## 2020-12-09 DIAGNOSIS — Z1231 Encounter for screening mammogram for malignant neoplasm of breast: Secondary | ICD-10-CM | POA: Diagnosis not present

## 2020-12-10 DIAGNOSIS — K219 Gastro-esophageal reflux disease without esophagitis: Secondary | ICD-10-CM | POA: Diagnosis not present

## 2020-12-10 DIAGNOSIS — J386 Stenosis of larynx: Secondary | ICD-10-CM | POA: Diagnosis not present

## 2020-12-10 DIAGNOSIS — F411 Generalized anxiety disorder: Secondary | ICD-10-CM | POA: Diagnosis not present

## 2020-12-10 DIAGNOSIS — Z79899 Other long term (current) drug therapy: Secondary | ICD-10-CM | POA: Diagnosis not present

## 2020-12-21 DIAGNOSIS — J3801 Paralysis of vocal cords and larynx, unilateral: Secondary | ICD-10-CM | POA: Diagnosis not present

## 2020-12-21 DIAGNOSIS — J386 Stenosis of larynx: Secondary | ICD-10-CM | POA: Diagnosis not present

## 2020-12-22 ENCOUNTER — Other Ambulatory Visit: Payer: Self-pay | Admitting: Otolaryngology

## 2021-01-03 ENCOUNTER — Other Ambulatory Visit (HOSPITAL_COMMUNITY)
Admission: RE | Admit: 2021-01-03 | Discharge: 2021-01-03 | Disposition: A | Discharge status: Home / Self Care | Payer: BC Managed Care – PPO | Source: Ambulatory Visit | Attending: Otolaryngology | Admitting: Otolaryngology

## 2021-01-03 DIAGNOSIS — Z885 Allergy status to narcotic agent status: Secondary | ICD-10-CM | POA: Diagnosis not present

## 2021-01-03 DIAGNOSIS — J386 Stenosis of larynx: Secondary | ICD-10-CM | POA: Diagnosis not present

## 2021-01-03 DIAGNOSIS — Z01812 Encounter for preprocedural laboratory examination: Secondary | ICD-10-CM | POA: Insufficient documentation

## 2021-01-03 DIAGNOSIS — Z8349 Family history of other endocrine, nutritional and metabolic diseases: Secondary | ICD-10-CM | POA: Diagnosis not present

## 2021-01-03 DIAGNOSIS — Z8249 Family history of ischemic heart disease and other diseases of the circulatory system: Secondary | ICD-10-CM | POA: Diagnosis not present

## 2021-01-03 DIAGNOSIS — Z833 Family history of diabetes mellitus: Secondary | ICD-10-CM | POA: Diagnosis not present

## 2021-01-03 DIAGNOSIS — Z87442 Personal history of urinary calculi: Secondary | ICD-10-CM | POA: Diagnosis not present

## 2021-01-03 DIAGNOSIS — J3801 Paralysis of vocal cords and larynx, unilateral: Secondary | ICD-10-CM | POA: Diagnosis not present

## 2021-01-03 DIAGNOSIS — Z79899 Other long term (current) drug therapy: Secondary | ICD-10-CM | POA: Diagnosis not present

## 2021-01-03 DIAGNOSIS — Z20822 Contact with and (suspected) exposure to covid-19: Secondary | ICD-10-CM | POA: Insufficient documentation

## 2021-01-04 ENCOUNTER — Encounter (HOSPITAL_COMMUNITY): Payer: Self-pay | Admitting: Otolaryngology

## 2021-01-04 ENCOUNTER — Other Ambulatory Visit: Payer: Self-pay

## 2021-01-04 LAB — SARS CORONAVIRUS 2 (TAT 6-24 HRS): SARS Coronavirus 2: NEGATIVE

## 2021-01-04 NOTE — Progress Notes (Signed)
Mrs. Meenan denies chest pain or shortness of breath. Patient  Tested neg for Covid on 12/2820 and has been in quarantine since that time.

## 2021-01-05 ENCOUNTER — Ambulatory Visit (HOSPITAL_COMMUNITY)
Admission: RE | Admit: 2021-01-05 | Discharge: 2021-01-05 | Disposition: A | Discharge status: Home / Self Care | Payer: BC Managed Care – PPO | Attending: Otolaryngology | Admitting: Otolaryngology

## 2021-01-05 ENCOUNTER — Encounter (HOSPITAL_COMMUNITY)
Admission: RE | Disposition: A | Discharge status: Home / Self Care | Payer: Self-pay | Source: Home / Self Care | Attending: Otolaryngology

## 2021-01-05 ENCOUNTER — Other Ambulatory Visit: Payer: Self-pay

## 2021-01-05 ENCOUNTER — Encounter (HOSPITAL_COMMUNITY): Payer: Self-pay | Admitting: Otolaryngology

## 2021-01-05 ENCOUNTER — Ambulatory Visit (HOSPITAL_COMMUNITY): Payer: BC Managed Care – PPO | Admitting: Certified Registered"

## 2021-01-05 DIAGNOSIS — J3801 Paralysis of vocal cords and larynx, unilateral: Secondary | ICD-10-CM | POA: Diagnosis not present

## 2021-01-05 DIAGNOSIS — J386 Stenosis of larynx: Secondary | ICD-10-CM | POA: Diagnosis not present

## 2021-01-05 DIAGNOSIS — Z8349 Family history of other endocrine, nutritional and metabolic diseases: Secondary | ICD-10-CM | POA: Insufficient documentation

## 2021-01-05 DIAGNOSIS — Z79899 Other long term (current) drug therapy: Secondary | ICD-10-CM | POA: Diagnosis not present

## 2021-01-05 DIAGNOSIS — F418 Other specified anxiety disorders: Secondary | ICD-10-CM | POA: Diagnosis not present

## 2021-01-05 DIAGNOSIS — Z20822 Contact with and (suspected) exposure to covid-19: Secondary | ICD-10-CM | POA: Diagnosis not present

## 2021-01-05 DIAGNOSIS — Z833 Family history of diabetes mellitus: Secondary | ICD-10-CM | POA: Diagnosis not present

## 2021-01-05 DIAGNOSIS — Z8249 Family history of ischemic heart disease and other diseases of the circulatory system: Secondary | ICD-10-CM | POA: Insufficient documentation

## 2021-01-05 DIAGNOSIS — Z885 Allergy status to narcotic agent status: Secondary | ICD-10-CM | POA: Diagnosis not present

## 2021-01-05 DIAGNOSIS — J45909 Unspecified asthma, uncomplicated: Secondary | ICD-10-CM | POA: Diagnosis not present

## 2021-01-05 DIAGNOSIS — Z87442 Personal history of urinary calculi: Secondary | ICD-10-CM | POA: Insufficient documentation

## 2021-01-05 HISTORY — PX: MICROLARYNGOSCOPY: SHX5208

## 2021-01-05 HISTORY — DX: Paralytic syndrome, unspecified: G83.9

## 2021-01-05 HISTORY — PX: MITOMYCIN C INJECTION: SHX6376

## 2021-01-05 HISTORY — PX: BALLOON DILATION: SHX5330

## 2021-01-05 HISTORY — PX: CO2 LASER APPLICATION: SHX5778

## 2021-01-05 LAB — CBC
HCT: 34.2 % — ABNORMAL LOW (ref 36.0–46.0)
Hemoglobin: 10.5 g/dL — ABNORMAL LOW (ref 12.0–15.0)
MCH: 26.1 pg (ref 26.0–34.0)
MCHC: 30.7 g/dL (ref 30.0–36.0)
MCV: 84.9 fL (ref 80.0–100.0)
Platelets: 383 10*3/uL (ref 150–400)
RBC: 4.03 MIL/uL (ref 3.87–5.11)
RDW: 14.6 % (ref 11.5–15.5)
WBC: 9.1 10*3/uL (ref 4.0–10.5)
nRBC: 0 % (ref 0.0–0.2)

## 2021-01-05 LAB — POCT PREGNANCY, URINE: Preg Test, Ur: NEGATIVE

## 2021-01-05 SURGERY — MICROLARYNGOSCOPY
Anesthesia: General | Site: Mouth

## 2021-01-05 MED ORDER — ONDANSETRON HCL 4 MG/2ML IJ SOLN
INTRAMUSCULAR | Status: AC
  Filled 2021-01-05: qty 2

## 2021-01-05 MED ORDER — ONDANSETRON HCL 4 MG/2ML IJ SOLN
INTRAMUSCULAR | Status: DC | PRN
  Administered 2021-01-05: 4 mg via INTRAVENOUS

## 2021-01-05 MED ORDER — OXYCODONE HCL 5 MG PO TABS: 5.0000 mg | ORAL_TABLET | Freq: Once | ORAL | Status: DC | PRN

## 2021-01-05 MED ORDER — PROPOFOL 500 MG/50ML IV EMUL
INTRAVENOUS | Status: DC | PRN
  Administered 2021-01-05: 100 ug/kg/min via INTRAVENOUS

## 2021-01-05 MED ORDER — ROCURONIUM BROMIDE 10 MG/ML (PF) SYRINGE
PREFILLED_SYRINGE | INTRAVENOUS | Status: DC | PRN
  Administered 2021-01-05: 30 mg via INTRAVENOUS

## 2021-01-05 MED ORDER — ORAL CARE MOUTH RINSE: 15.0000 mL | Freq: Once | OROMUCOSAL | Status: DC

## 2021-01-05 MED ORDER — ROCURONIUM BROMIDE 10 MG/ML (PF) SYRINGE
PREFILLED_SYRINGE | INTRAVENOUS | Status: AC
  Filled 2021-01-05: qty 10

## 2021-01-05 MED ORDER — OXYCODONE HCL 5 MG/5ML PO SOLN: 5.0000 mg | Freq: Once | ORAL | Status: DC | PRN

## 2021-01-05 MED ORDER — LACTATED RINGERS IV SOLN: INTRAVENOUS | Status: DC

## 2021-01-05 MED ORDER — PROMETHAZINE HCL 25 MG/ML IJ SOLN: 6.2500 mg | INTRAMUSCULAR | Status: DC | PRN

## 2021-01-05 MED ORDER — SUGAMMADEX SODIUM 200 MG/2ML IV SOLN
INTRAVENOUS | Status: DC | PRN
  Administered 2021-01-05: 160 mg via INTRAVENOUS

## 2021-01-05 MED ORDER — PROPOFOL 10 MG/ML IV BOLUS
INTRAVENOUS | Status: AC
  Filled 2021-01-05: qty 20

## 2021-01-05 MED ORDER — MIDAZOLAM HCL 2 MG/2ML IJ SOLN
INTRAMUSCULAR | Status: DC | PRN
  Administered 2021-01-05: 2 mg via INTRAVENOUS

## 2021-01-05 MED ORDER — ACETAMINOPHEN 10 MG/ML IV SOLN
INTRAVENOUS | Status: AC
  Filled 2021-01-05: qty 100

## 2021-01-05 MED ORDER — MIDAZOLAM HCL 2 MG/2ML IJ SOLN
INTRAMUSCULAR | Status: AC
  Filled 2021-01-05: qty 2

## 2021-01-05 MED ORDER — DEXAMETHASONE SODIUM PHOSPHATE 10 MG/ML IJ SOLN
INTRAMUSCULAR | Status: AC
  Filled 2021-01-05: qty 1

## 2021-01-05 MED ORDER — FENTANYL CITRATE (PF) 100 MCG/2ML IJ SOLN
INTRAMUSCULAR | Status: DC | PRN
  Administered 2021-01-05: 150 ug via INTRAVENOUS

## 2021-01-05 MED ORDER — KETOROLAC TROMETHAMINE 30 MG/ML IJ SOLN: 30.0000 mg | Freq: Once | INTRAMUSCULAR | Status: DC

## 2021-01-05 MED ORDER — MITOMYCIN-C INJECTION USE IN OR ONLY (0.4 MG/ML)
0.5000 mL | Freq: Once | INTRAVENOUS | Status: DC
  Filled 2021-01-05: qty 0.5

## 2021-01-05 MED ORDER — MITOMYCIN 0.2 MG OP KIT
PACK | OPHTHALMIC | Status: DC | PRN
  Administered 2021-01-05: 0.2 mg

## 2021-01-05 MED ORDER — AMISULPRIDE (ANTIEMETIC) 5 MG/2ML IV SOLN: 10.0000 mg | Freq: Once | INTRAVENOUS | Status: DC | PRN

## 2021-01-05 MED ORDER — PROPOFOL 10 MG/ML IV BOLUS
INTRAVENOUS | Status: DC | PRN
  Administered 2021-01-05: 200 mg via INTRAVENOUS

## 2021-01-05 MED ORDER — 0.9 % SODIUM CHLORIDE (POUR BTL) OPTIME
TOPICAL | Status: DC | PRN
  Administered 2021-01-05: 1000 mL

## 2021-01-05 MED ORDER — LIDOCAINE 2% (20 MG/ML) 5 ML SYRINGE
INTRAMUSCULAR | Status: DC | PRN
  Administered 2021-01-05: 60 mg via INTRAVENOUS

## 2021-01-05 MED ORDER — FENTANYL CITRATE (PF) 250 MCG/5ML IJ SOLN
INTRAMUSCULAR | Status: AC
  Filled 2021-01-05: qty 5

## 2021-01-05 MED ORDER — CHLORHEXIDINE GLUCONATE 0.12 % MT SOLN: 15.0000 mL | Freq: Once | OROMUCOSAL | Status: DC

## 2021-01-05 MED ORDER — ACETAMINOPHEN 10 MG/ML IV SOLN
1000.0000 mg | Freq: Once | INTRAVENOUS | Status: DC | PRN
  Administered 2021-01-05: 1000 mg via INTRAVENOUS

## 2021-01-05 MED ORDER — FENTANYL CITRATE (PF) 100 MCG/2ML IJ SOLN: 25.0000 ug | INTRAMUSCULAR | Status: DC | PRN

## 2021-01-05 MED ORDER — STERILE WATER FOR IRRIGATION IR SOLN
Status: DC | PRN
  Administered 2021-01-05: 1000 mL

## 2021-01-05 MED ORDER — DEXAMETHASONE SODIUM PHOSPHATE 10 MG/ML IJ SOLN
INTRAMUSCULAR | Status: DC | PRN
  Administered 2021-01-05: 10 mg via INTRAVENOUS

## 2021-01-05 MED ORDER — LIDOCAINE 2% (20 MG/ML) 5 ML SYRINGE
INTRAMUSCULAR | Status: AC
  Filled 2021-01-05: qty 5

## 2021-01-05 SURGICAL SUPPLY — 37 items
BALLN PULM 12 13.5 15X75 (BALLOONS)
BALLN PULM 15 16.5 18X75 (BALLOONS) ×3
BALLN PULMONARY 10-12 (MISCELLANEOUS) IMPLANT
BALLOON PULM 12 13.5 15X75 (BALLOONS) IMPLANT
BALLOON PULM 15 16.5 18X75 (BALLOONS) ×1 IMPLANT
BLADE SURG 15 STRL LF DISP TIS (BLADE) IMPLANT
BLADE SURG 15 STRL SS (BLADE)
BNDG EYE OVAL (GAUZE/BANDAGES/DRESSINGS) ×6 IMPLANT
CANISTER SUCT 3000ML PPV (MISCELLANEOUS) ×3 IMPLANT
CNTNR URN SCR LID CUP LEK RST (MISCELLANEOUS) IMPLANT
CONT SPEC 4OZ STRL OR WHT (MISCELLANEOUS)
COVER BACK TABLE 60X90IN (DRAPES) ×3 IMPLANT
COVER MAYO STAND STRL (DRAPES) ×3 IMPLANT
COVER WAND RF STERILE (DRAPES) ×1 IMPLANT
DRAPE HALF SHEET 40X57 (DRAPES) ×3 IMPLANT
GAUZE SPONGE 4X4 12PLY STRL (GAUZE/BANDAGES/DRESSINGS) ×1 IMPLANT
GLOVE BIO SURGEON STRL SZ7.5 (GLOVE) ×3 IMPLANT
GOWN STRL REUS W/ TWL LRG LVL3 (GOWN DISPOSABLE) IMPLANT
GOWN STRL REUS W/TWL LRG LVL3 (GOWN DISPOSABLE)
KIT BASIN OR (CUSTOM PROCEDURE TRAY) ×3 IMPLANT
KIT PROLARN PLUS GEL W/NDL (Prosthesis and Implant ENT) ×2 IMPLANT
KIT TURNOVER KIT B (KITS) ×3 IMPLANT
NDL HYPO 25GX1X1/2 BEV (NEEDLE) IMPLANT
NEEDLE HYPO 25GX1X1/2 BEV (NEEDLE) IMPLANT
NS IRRIG 1000ML POUR BTL (IV SOLUTION) ×3 IMPLANT
PAD ARMBOARD 7.5X6 YLW CONV (MISCELLANEOUS) ×6 IMPLANT
PATTIES SURGICAL .5 X1 (DISPOSABLE) ×1 IMPLANT
PATTIES SURGICAL .5 X3 (DISPOSABLE) ×1 IMPLANT
POSITIONER HEAD DONUT 9IN (MISCELLANEOUS) IMPLANT
SOL ANTI FOG 6CC (MISCELLANEOUS) ×2 IMPLANT
SOLUTION ANTI FOG 6CC (MISCELLANEOUS) ×1
SURGILUBE 2OZ TUBE FLIPTOP (MISCELLANEOUS) IMPLANT
SUT SILK 2 0 PERMA HAND 18 BK (SUTURE) IMPLANT
SYR GAUGE ALLIANCE SINGLE USE (MISCELLANEOUS) ×2 IMPLANT
TOWEL GREEN STERILE FF (TOWEL DISPOSABLE) ×3 IMPLANT
TUBE CONNECTING 12X1/4 (SUCTIONS) ×3 IMPLANT
WATER STERILE IRR 1000ML POUR (IV SOLUTION) ×3 IMPLANT

## 2021-01-05 NOTE — Transfer of Care (Signed)
Immediate Anesthesia Transfer of Care Note  Patient: Karen Wolfe  Procedure(s) Performed: MICROLARYNGOSCOPY (Mouth) CO2 LASER APPLICATION (Mouth) BALLOON DILATION (Mouth) MITOMYCIN C INJECTION, PROLARYN INECTION (Mouth)  Patient Location: PACU  Anesthesia Type:General  Level of Consciousness: awake, alert  and oriented  Airway & Oxygen Therapy: Patient Spontanous Breathing and Patient connected to face mask oxygen  Post-op Assessment: Report given to RN  Post vital signs: Reviewed and stable  Last Vitals:  Vitals Value Taken Time  BP    Temp    Pulse 109 01/05/21 1416  Resp 13 01/05/21 1416  SpO2 100 % 01/05/21 1416  Vitals shown include unvalidated device data.  Last Pain:  Vitals:   01/05/21 1108  TempSrc: Oral         Complications: No complications documented.

## 2021-01-05 NOTE — Anesthesia Preprocedure Evaluation (Addendum)
Anesthesia Evaluation  Patient identified by MRN, date of birth, ID band Patient awake    Reviewed: Allergy & Precautions, NPO status , Patient's Chart, lab work & pertinent test results  History of Anesthesia Complications (+) PONV and history of anesthetic complications  Airway Mallampati: II  TM Distance: >3 FB Neck ROM: Full    Dental no notable dental hx.    Pulmonary shortness of breath, asthma , sleep apnea and Continuous Positive Airway Pressure Ventilation ,    Pulmonary exam normal breath sounds clear to auscultation       Cardiovascular negative cardio ROS Normal cardiovascular exam Rhythm:Regular Rate:Normal     Neuro/Psych PSYCHIATRIC DISORDERS Anxiety Depression negative neurological ROS     GI/Hepatic Neg liver ROS, GERD  Medicated and Controlled,  Endo/Other  negative endocrine ROS  Renal/GU negative Renal ROS     Musculoskeletal negative musculoskeletal ROS (+)   Abdominal (+) + obese,   Peds  Hematology negative hematology ROS (+) anemia ,   Anesthesia Other Findings Subglottic stenosis  Vocal fold paresis, right  Reproductive/Obstetrics                           Anesthesia Physical Anesthesia Plan  ASA: III  Anesthesia Plan: General   Post-op Pain Management:    Induction: Intravenous and Inhalational  PONV Risk Score and Plan: 4 or greater and Ondansetron, Dexamethasone, Midazolam, Treatment may vary due to age or medical condition and Propofol infusion  Airway Management Planned: Mask  Additional Equipment:   Intra-op Plan:   Post-operative Plan: Extubation in OR  Informed Consent: I have reviewed the patients History and Physical, chart, labs and discussed the procedure including the risks, benefits and alternatives for the proposed anesthesia with the patient or authorized representative who has indicated his/her understanding and acceptance.      Dental advisory given  Plan Discussed with: CRNA  Anesthesia Plan Comments: (Jet ventilation )       Anesthesia Quick Evaluation

## 2021-01-05 NOTE — Op Note (Signed)
PREOPERATIVE DIAGNOSIS:  Subglottic stenosis and right vocal fold paresis   POSTOPERATIVE DIAGNOSIS:  Subglottic stenosis and right vocal fold paresis   PROCEDURE:  Suspended microdirect laryngoscopy with CO2 laser dilation and right vocal fold Prolaryn injection   SURGEON:  Melida Quitter, MD   ANESTHESIA:  General with jet ventilation by anesthesia.   COMPLICATIONS:  None.   INDICATIONS:  The patient is a 45 year old female with a history of idiopathic subglottic stenosis that has required serial dilations, her last being in 09/2019.  She has also developed hoarseness and was found to have weakness of the right vocal fold.  She presents to the operating room for surgical management.   FINDINGS:  50% circumferential stenosis of subglottis.  0.3 cc of Prolaryn injected in right vocal fold.   DESCRIPTION OF PROCEDURE:  The patient was identified in the holding room, informed consent having been obtained including discussion of risks, benefits and alternatives, the patient was brought to the operative suite and put the operative table in  supine position.  Anesthesia was induced and the patient was maintained via mask ventilation.  The eyes were taped closed and bed was turned 90 degrees from anesthesia.  Damp eye pads were taped over the eyes and a damp towel was placed over the face.  The patient was given intravenous steroids during the case.  A tooth guard was placed over the upper teeth and a Stortz laryngoscope was placed into the glottic position and suspended to Mayo stand using the Lewy arm.  A 5.0 endotracheal tube was placed and the patient was ventilated.  The tube was removed and a zero degree endoscope was used to make a pre-intervention photo.  The CO2 laser was then used on a setting of 8 watts to make radial incisions at 12, 3, and 9 o'clock.  The endotracheal tube was replaced for a minute for ventilation that was then removed.  The large tracheal balloon was inserted and inflated to 7  atm for 60 seconds twice.  Jet ventilation was then initiated.  A section of pledget sutured around a segment of suction tubing and soaked with mitomycin (0.4 mg/ml, 0.5 ml) was then placed at the site of dilation and the patient jet ventilated through the segment for 3 minutes.  The segment was removed.  A post-intervention photo was made with the endoscope.  The laryngoscope was then backed to a supraglottic position.  Prolaryn was then injected into the right vocal fold, totaling 0.3 cc with 2/3 in the posterior position and 1/3 in the anterior position.  The larynx was sprayed with topical lidocaine.  The laryngoscope was then taking out of suspension and removed from the patient's mouth while suctioning the airway.  The tooth guard was removed and the patient was turned back to anesthesia for wakeup and taken to the recovery room in stable condition.

## 2021-01-05 NOTE — Anesthesia Postprocedure Evaluation (Signed)
Anesthesia Post Note  Patient: Karen Wolfe  Procedure(s) Performed: MICROLARYNGOSCOPY (Mouth) CO2 LASER APPLICATION (Mouth) BALLOON DILATION (Mouth) MITOMYCIN C INJECTION, PROLARYN INECTION (Mouth)     Patient location during evaluation: PACU Anesthesia Type: General Level of consciousness: awake Pain management: pain level controlled Vital Signs Assessment: post-procedure vital signs reviewed and stable Respiratory status: spontaneous breathing, nonlabored ventilation, respiratory function stable and patient connected to nasal cannula oxygen Cardiovascular status: blood pressure returned to baseline and stable Postop Assessment: no apparent nausea or vomiting Anesthetic complications: no   No complications documented.  Last Vitals:  Vitals:   01/05/21 1500 01/05/21 1515  BP: 112/62 112/64  Pulse: 91 92  Resp: 19 18  Temp:    SpO2: 95% 94%    Last Pain:  Vitals:   01/05/21 1500  TempSrc:   PainSc: 2                  Corwin Kuiken P Soraya Paquette

## 2021-01-05 NOTE — H&P (Signed)
Karen Wolfe is an 45 y.o. female.   Chief Complaint: Subglottic stenosis and right vocal fold paresis HPI: 45 year old female with idiopathic subglottic stenosis who has required serial dilation, her last in 09/2019.  She has noticed worsened difficulty breathing, particularly with exertion, over the past couple of months.  She has also noticed worsened hoarseness over several months.  She was found to have right vocal fold paresis and presents for surgical management.  Past Medical History:  Diagnosis Date  . Anemia    during pregnancy only  . Anxiety   . Asthma   . Depression   . GERD (gastroesophageal reflux disease)    ? silent reflux  . History of kidney stones   . Paralysis (HCC)    Right Vocal Cord  . Pneumonia    " walking PNA"  . PONV (postoperative nausea and vomiting)   . Shortness of breath dyspnea    with exertion- upper airway stricture  . Sleep apnea    wears CPAP  . Subglottic stenosis   . Vaginal delivery    epidural x2     Past Surgical History:  Procedure Laterality Date  . DILATATION & CURETTAGE/HYSTEROSCOPY WITH MYOSURE N/A 11/22/2016   Procedure: DILATATION & CURETTAGE/HYSTEROSCOPY WITH MYOSURE;  Surgeon: Maxie Better, MD;  Location: [facility];  Service: Gynecology;  Laterality: N/A;  . MICROLARYNGOSCOPY WITH DILATION N/A 08/09/2015   Procedure: MICROLARYNGOSCOPY WITH DILATION;  Surgeon: Christia Reading, MD;  Location: [facility] OR;  Service: ENT;  Laterality: N/A;  MICRO DIRECT LARYNGOSCOPY WITH CO2 LASER DILATION/JET VENTILATION  . MICROLARYNGOSCOPY WITH DILATION N/A 11/18/2018   Procedure: MICROLARYNGOSCOPY WITH CO2 DILATION, MITOMYCIN c AND JET VENTILATION;  Surgeon: Christia Reading, MD;  Location: [facility] OR;  Service: ENT;  Laterality: N/A;  . MICROLARYNGOSCOPY WITH DILATION N/A 09/30/2019   Procedure: MICROLARYNGOSCOPY WITH CO2 LASER BALLOON DILATION;  Surgeon: Christia Reading, MD;  Location: [facility] OR;  Service: ENT;  Laterality: N/A;  . VARICOSE VEIN SURGERY    .  WISDOM TOOTH EXTRACTION      Family History  Problem Relation Age of Onset  . Diabetes Mother   . Hypertension Mother   . Graves' disease Mother   . Diabetes Father   . Hypertension Father    Social History:  reports that she has never smoked. She has never used smokeless tobacco. She reports that she does not drink alcohol and does not use drugs.  Allergies:  Allergies  Allergen Reactions  . Mold Extract [Trichophyton] Shortness Of Breath  . Shellfish Allergy Anaphylaxis, Shortness Of Breath and Nausea And Vomiting  . Fentanyl Nausea And Vomiting    Medications Prior to Admission  Medication Sig Dispense Refill  . acetaminophen (TYLENOL) 325 MG tablet Take 650 mg by mouth every 6 (six) hours as needed for moderate pain or headache.    Marland Kitchen omeprazole (PRILOSEC) 40 MG capsule Take 40 mg by mouth daily.    . sertraline (ZOLOFT) 50 MG tablet Take 50 mg by mouth daily.    Marland Kitchen albuterol (VENTOLIN HFA) 108 (90 Base) MCG/ACT inhaler Inhale 2 puffs into the lungs every 4 (four) hours as needed for wheezing or shortness of breath.      Results for orders placed or performed during the hospital encounter of 01/03/21 (from the past 48 hour(s))  SARS CORONAVIRUS 2 (TAT 6-24 HRS) Nasopharyngeal Nasopharyngeal Swab     Status: None   Collection Time: 01/03/21  3:05 PM   Specimen: Nasopharyngeal Swab  Result Value Ref Range  SARS Coronavirus 2 NEGATIVE NEGATIVE    Comment: (NOTE) SARS-CoV-2 target nucleic acids are NOT DETECTED.  The SARS-CoV-2 RNA is generally detectable in upper and lower respiratory specimens during the acute phase of infection. Negative results do not preclude SARS-CoV-2 infection, do not rule out co-infections with other pathogens, and should not be used as the sole basis for treatment or other patient management decisions. Negative results must be combined with clinical observations, patient history, and epidemiological information. The expected result is  Negative.  Fact Sheet for Patients: HairSlick.no  Fact Sheet for Healthcare Providers: quierodirigir.com  This test is not yet approved or cleared by the [place] FDA and  has been authorized for detection and/or diagnosis of SARS-CoV-2 by FDA under an Emergency Use Authorization (EUA). This EUA will remain  in effect (meaning this test can be used) for the duration of the COVID-19 declaration under Se ction 564(b)(1) of the Act, 21 U.S.C. section 360bbb-3(b)(1), unless the authorization is terminated or revoked sooner.  Performed at [facility], [address] [place], [place] [address]    No results found.  Review of Systems  All other systems reviewed and are negative.   Blood pressure (!) 142/84, pulse 86, temperature 97.8 F (36.6 C), temperature source Oral, resp. rate 18, height 5\' 3"  (1.6 m), weight 86.2 kg, last menstrual period 11/30/2020, SpO2 99 %. Physical Exam Constitutional:      Appearance: Normal appearance. She is normal weight.  HENT:     Head: Normocephalic and atraumatic.     Right Ear: External ear normal.     Left Ear: External ear normal.     Nose: Nose normal.     Mouth/Throat:     Mouth: Mucous membranes are moist.     Pharynx: Oropharynx is clear.  Eyes:     Extraocular Movements: Extraocular movements intact.     Conjunctiva/sclera: Conjunctivae normal.     Pupils: Pupils are equal, round, and reactive to light.  Cardiovascular:     Rate and Rhythm: Normal rate.  Pulmonary:     Effort: Pulmonary effort is normal.     Comments: Soft inspiratory stridor with deep breathing.  Voice scratchy. Musculoskeletal:     Cervical back: Normal range of motion.  Skin:    General: Skin is warm and dry.  Neurological:     General: No focal deficit present.     Mental Status: She is alert and oriented to person, place, and time.  Psychiatric:        Mood and Affect: Mood normal.         Behavior: Behavior normal.        Thought Content: Thought content normal.        Judgment: Judgment normal.      Assessment/Plan Subglottic stenosis, right vocal fold paresis  To OR for SMDL with CO2 laser dilation and mitomycin C and right vocal fold Prolaryn injection.  12/02/2020, MD 01/05/2021, 11:44 AM

## 2021-01-05 NOTE — Brief Op Note (Signed)
01/05/2021  2:04 PM  PATIENT:  Karen Wolfe  45 y.o. female  PRE-OPERATIVE DIAGNOSIS:  Subglottic stenosis  Vocal fold paresis, right  POST-OPERATIVE DIAGNOSIS:  Subglottic stenosis  Vocal fold paresis, right  PROCEDURE:  Procedure(s): MICROLARYNGOSCOPY CO2 LASER APPLICATION BALLOON DILATION MITOMYCIN C INJECTION, PROLARYN INECTION  SURGEON:  Surgeon(s) and Role:    * Christia Reading, MD - Primary  PHYSICIAN ASSISTANT:   ASSISTANTS: none   ANESTHESIA:   general  EBL: Minimal  BLOOD ADMINISTERED:none  DRAINS: none   LOCAL MEDICATIONS USED:  NONE  SPECIMEN:  No Specimen  DISPOSITION OF SPECIMEN:  N/A  COUNTS:  YES  TOURNIQUET:  * No tourniquets in log *  DICTATION: .Note written in EPIC  PLAN OF CARE: Discharge to home after PACU  PATIENT DISPOSITION:  PACU - hemodynamically stable.   Delay start of Pharmacological VTE agent (>24hrs) due to surgical blood loss or risk of bleeding: no

## 2021-01-06 ENCOUNTER — Encounter (HOSPITAL_COMMUNITY): Payer: Self-pay | Admitting: Otolaryngology

## 2021-01-07 DIAGNOSIS — J3801 Paralysis of vocal cords and larynx, unilateral: Secondary | ICD-10-CM | POA: Diagnosis not present

## 2021-01-07 DIAGNOSIS — J386 Stenosis of larynx: Secondary | ICD-10-CM | POA: Diagnosis not present

## 2021-01-07 DIAGNOSIS — R490 Dysphonia: Secondary | ICD-10-CM | POA: Diagnosis not present

## 2021-01-25 DIAGNOSIS — J386 Stenosis of larynx: Secondary | ICD-10-CM | POA: Diagnosis not present

## 2021-01-25 DIAGNOSIS — J3801 Paralysis of vocal cords and larynx, unilateral: Secondary | ICD-10-CM | POA: Diagnosis not present

## 2021-01-25 DIAGNOSIS — R490 Dysphonia: Secondary | ICD-10-CM | POA: Diagnosis not present

## 2021-02-11 DIAGNOSIS — J386 Stenosis of larynx: Secondary | ICD-10-CM | POA: Diagnosis not present

## 2021-02-11 DIAGNOSIS — Z13220 Encounter for screening for lipoid disorders: Secondary | ICD-10-CM | POA: Diagnosis not present

## 2021-02-11 DIAGNOSIS — Z130 Encounter for screening for diseases of the blood and blood-forming organs and certain disorders involving the immune mechanism: Secondary | ICD-10-CM | POA: Diagnosis not present

## 2021-02-11 DIAGNOSIS — R5383 Other fatigue: Secondary | ICD-10-CM | POA: Diagnosis not present

## 2021-02-11 DIAGNOSIS — Z6837 Body mass index (BMI) 37.0-37.9, adult: Secondary | ICD-10-CM | POA: Diagnosis not present

## 2021-02-11 DIAGNOSIS — J3801 Paralysis of vocal cords and larynx, unilateral: Secondary | ICD-10-CM | POA: Diagnosis not present

## 2021-02-11 DIAGNOSIS — E559 Vitamin D deficiency, unspecified: Secondary | ICD-10-CM | POA: Diagnosis not present

## 2021-02-11 DIAGNOSIS — Z0000 Encounter for general adult medical examination without abnormal findings: Secondary | ICD-10-CM | POA: Diagnosis not present

## 2021-02-28 DIAGNOSIS — S76311A Strain of muscle, fascia and tendon of the posterior muscle group at thigh level, right thigh, initial encounter: Secondary | ICD-10-CM | POA: Diagnosis not present

## 2022-06-14 ENCOUNTER — Other Ambulatory Visit: Payer: Self-pay | Admitting: Otolaryngology

## 2022-06-29 ENCOUNTER — Other Ambulatory Visit: Payer: Self-pay

## 2022-06-29 ENCOUNTER — Encounter (HOSPITAL_COMMUNITY): Payer: Self-pay | Admitting: Otolaryngology

## 2022-06-29 NOTE — Anesthesia Preprocedure Evaluation (Addendum)
Anesthesia Evaluation  Patient identified by MRN, date of birth, ID band Patient awake    Reviewed: Allergy & Precautions, NPO status , Patient's Chart, lab work & pertinent test results  History of Anesthesia Complications (+) PONV  Airway Mallampati: II  TM Distance: >3 FB Neck ROM: Full    Dental  (+) Teeth Intact, Dental Advisory Given   Pulmonary shortness of breath, asthma , sleep apnea and Continuous Positive Airway Pressure Ventilation ,  R vocal cord paralysis Subglottic stenosis   breath sounds clear to auscultation       Cardiovascular negative cardio ROS   Rhythm:Regular Rate:Normal     Neuro/Psych negative neurological ROS     GI/Hepatic Neg liver ROS, GERD  Medicated and Controlled,  Endo/Other  Morbid obesity  Renal/GU negative Renal ROS     Musculoskeletal   Abdominal (+) + obese,   Peds  Hematology negative hematology ROS (+)   Anesthesia Other Findings   Reproductive/Obstetrics                            Anesthesia Physical Anesthesia Plan  ASA: 3  Anesthesia Plan: General   Post-op Pain Management: Tylenol PO (pre-op)*   Induction: Intravenous  PONV Risk Score and Plan: 3 and Ondansetron, Dexamethasone and Scopolamine patch - Pre-op  Airway Management Planned: Oral ETT  Additional Equipment: None  Intra-op Plan:   Post-operative Plan: Extubation in OR  Informed Consent: I have reviewed the patients History and Physical, chart, labs and discussed the procedure including the risks, benefits and alternatives for the proposed anesthesia with the patient or authorized representative who has indicated his/her understanding and acceptance.     Dental advisory given  Plan Discussed with: CRNA and Surgeon  Anesthesia Plan Comments: (Jet ventilation intraop   )       Anesthesia Quick Evaluation

## 2022-06-29 NOTE — Progress Notes (Signed)
Spoke with pt for pre-op call. Pt denies HTN, cardiac history or diabetes.   Shower instructions given to pt and she voiced understanding.

## 2022-06-30 ENCOUNTER — Ambulatory Visit (HOSPITAL_COMMUNITY)
Admission: RE | Admit: 2022-06-30 | Discharge: 2022-06-30 | Disposition: A | Discharge status: Home / Self Care | Payer: BC Managed Care – PPO | Attending: Otolaryngology | Admitting: Otolaryngology

## 2022-06-30 ENCOUNTER — Ambulatory Visit (HOSPITAL_COMMUNITY): Payer: BC Managed Care – PPO | Admitting: Certified Registered Nurse Anesthetist

## 2022-06-30 ENCOUNTER — Other Ambulatory Visit: Payer: Self-pay

## 2022-06-30 ENCOUNTER — Encounter (HOSPITAL_COMMUNITY)
Admission: RE | Disposition: A | Discharge status: Home / Self Care | Payer: Self-pay | Source: Home / Self Care | Attending: Otolaryngology

## 2022-06-30 DIAGNOSIS — Z6836 Body mass index (BMI) 36.0-36.9, adult: Secondary | ICD-10-CM | POA: Insufficient documentation

## 2022-06-30 DIAGNOSIS — J386 Stenosis of larynx: Secondary | ICD-10-CM | POA: Diagnosis not present

## 2022-06-30 DIAGNOSIS — K219 Gastro-esophageal reflux disease without esophagitis: Secondary | ICD-10-CM | POA: Diagnosis not present

## 2022-06-30 DIAGNOSIS — G4730 Sleep apnea, unspecified: Secondary | ICD-10-CM | POA: Insufficient documentation

## 2022-06-30 DIAGNOSIS — J45909 Unspecified asthma, uncomplicated: Secondary | ICD-10-CM | POA: Insufficient documentation

## 2022-06-30 HISTORY — PX: MICROLARYNGOSCOPY: SHX5208

## 2022-06-30 HISTORY — DX: COVID-19: U07.1

## 2022-06-30 LAB — CBC
HCT: 31.3 % — ABNORMAL LOW (ref 36.0–46.0)
Hemoglobin: 9.9 g/dL — ABNORMAL LOW (ref 12.0–15.0)
MCH: 25.6 pg — ABNORMAL LOW (ref 26.0–34.0)
MCHC: 31.6 g/dL (ref 30.0–36.0)
MCV: 80.9 fL (ref 80.0–100.0)
Platelets: 353 10*3/uL (ref 150–400)
RBC: 3.87 MIL/uL (ref 3.87–5.11)
RDW: 15.2 % (ref 11.5–15.5)
WBC: 10.7 10*3/uL — ABNORMAL HIGH (ref 4.0–10.5)
nRBC: 0 % (ref 0.0–0.2)

## 2022-06-30 LAB — POCT PREGNANCY, URINE: Preg Test, Ur: NEGATIVE

## 2022-06-30 SURGERY — MICROLARYNGOSCOPY
Anesthesia: General | Site: Throat

## 2022-06-30 MED ORDER — LIDOCAINE 2% (20 MG/ML) 5 ML SYRINGE
INTRAMUSCULAR | Status: AC
Start: 2022-06-30 — End: ?
  Filled 2022-06-30: qty 5

## 2022-06-30 MED ORDER — MITOMYCIN-C INJECTION USE IN OR ONLY (0.4 MG/ML)
0.5000 mL | Freq: Once | INTRAVENOUS | Status: AC
  Administered 2022-06-30: .5 mL
  Filled 2022-06-30 (×3): qty 0.5

## 2022-06-30 MED ORDER — GLYCOPYRROLATE PF 0.2 MG/ML IJ SOSY
PREFILLED_SYRINGE | INTRAMUSCULAR | Status: DC | PRN
  Administered 2022-06-30: .2 mg via INTRAVENOUS

## 2022-06-30 MED ORDER — SCOPOLAMINE 1 MG/3DAYS TD PT72
1.0000 | MEDICATED_PATCH | TRANSDERMAL | Status: DC
  Administered 2022-06-30: 1.5 mg via TRANSDERMAL
  Filled 2022-06-30: qty 1

## 2022-06-30 MED ORDER — PROPOFOL 10 MG/ML IV BOLUS
INTRAVENOUS | Status: AC
  Filled 2022-06-30: qty 20

## 2022-06-30 MED ORDER — PROPOFOL 500 MG/50ML IV EMUL
INTRAVENOUS | Status: DC | PRN
  Administered 2022-06-30: 150 ug/kg/min via INTRAVENOUS

## 2022-06-30 MED ORDER — PROMETHAZINE HCL 25 MG/ML IJ SOLN: 6.2500 mg | INTRAMUSCULAR | Status: DC | PRN

## 2022-06-30 MED ORDER — ONDANSETRON HCL 4 MG/2ML IJ SOLN
INTRAMUSCULAR | Status: AC
Start: 2022-06-30 — End: ?
  Filled 2022-06-30: qty 2

## 2022-06-30 MED ORDER — ROCURONIUM BROMIDE 10 MG/ML (PF) SYRINGE
PREFILLED_SYRINGE | INTRAVENOUS | Status: DC | PRN
  Administered 2022-06-30: 50 mg via INTRAVENOUS

## 2022-06-30 MED ORDER — MEPERIDINE HCL 25 MG/ML IJ SOLN: 6.2500 mg | INTRAMUSCULAR | Status: DC | PRN

## 2022-06-30 MED ORDER — LACTATED RINGERS IV SOLN: INTRAVENOUS | Status: DC

## 2022-06-30 MED ORDER — MIDAZOLAM HCL 5 MG/5ML IJ SOLN
INTRAMUSCULAR | Status: DC | PRN
  Administered 2022-06-30: 2 mg via INTRAVENOUS

## 2022-06-30 MED ORDER — CEFAZOLIN SODIUM-DEXTROSE 2-4 GM/100ML-% IV SOLN
2.0000 g | INTRAVENOUS | Status: AC
  Administered 2022-06-30: 2 g via INTRAVENOUS
  Filled 2022-06-30: qty 100

## 2022-06-30 MED ORDER — ONDANSETRON HCL 4 MG/2ML IJ SOLN
INTRAMUSCULAR | Status: DC | PRN
  Administered 2022-06-30: 4 mg via INTRAVENOUS

## 2022-06-30 MED ORDER — PROPOFOL 10 MG/ML IV BOLUS
INTRAVENOUS | Status: DC | PRN
  Administered 2022-06-30: 100 mg via INTRAVENOUS
  Administered 2022-06-30: 50 mg via INTRAVENOUS

## 2022-06-30 MED ORDER — EPINEPHRINE HCL (NASAL) 0.1 % NA SOLN
NASAL | Status: AC
  Filled 2022-06-30: qty 30

## 2022-06-30 MED ORDER — MIDAZOLAM HCL 2 MG/2ML IJ SOLN
INTRAMUSCULAR | Status: AC
  Filled 2022-06-30: qty 2

## 2022-06-30 MED ORDER — ACETAMINOPHEN 500 MG PO TABS
1000.0000 mg | ORAL_TABLET | Freq: Once | ORAL | Status: AC
  Administered 2022-06-30: 1000 mg via ORAL
  Filled 2022-06-30: qty 2

## 2022-06-30 MED ORDER — FENTANYL CITRATE (PF) 250 MCG/5ML IJ SOLN
INTRAMUSCULAR | Status: DC | PRN
Start: 2022-06-30 — End: 2022-06-30
  Administered 2022-06-30: 100 ug via INTRAVENOUS

## 2022-06-30 MED ORDER — LIDOCAINE 2% (20 MG/ML) 5 ML SYRINGE
INTRAMUSCULAR | Status: DC | PRN
  Administered 2022-06-30: 40 mg via INTRAVENOUS

## 2022-06-30 MED ORDER — DEXAMETHASONE SODIUM PHOSPHATE 10 MG/ML IJ SOLN
INTRAMUSCULAR | Status: AC
  Filled 2022-06-30: qty 1

## 2022-06-30 MED ORDER — OXYCODONE HCL 5 MG PO TABS: 5.0000 mg | ORAL_TABLET | Freq: Once | ORAL | Status: DC | PRN

## 2022-06-30 MED ORDER — EPINEPHRINE HCL (NASAL) 0.1 % NA SOLN
NASAL | Status: DC | PRN
  Administered 2022-06-30: 30 mL via TOPICAL

## 2022-06-30 MED ORDER — FENTANYL CITRATE (PF) 250 MCG/5ML IJ SOLN
INTRAMUSCULAR | Status: AC
  Filled 2022-06-30: qty 5

## 2022-06-30 MED ORDER — MIDAZOLAM HCL 2 MG/2ML IJ SOLN: 0.5000 mg | Freq: Once | INTRAMUSCULAR | Status: DC | PRN

## 2022-06-30 MED ORDER — CHLORHEXIDINE GLUCONATE 0.12 % MT SOLN
15.0000 mL | Freq: Once | OROMUCOSAL | Status: AC
Start: 2022-06-30 — End: 2022-06-30
  Administered 2022-06-30: 15 mL via OROMUCOSAL
  Filled 2022-06-30: qty 15

## 2022-06-30 MED ORDER — OXYCODONE HCL 5 MG/5ML PO SOLN: 5.0000 mg | Freq: Once | ORAL | Status: DC | PRN

## 2022-06-30 MED ORDER — ROCURONIUM BROMIDE 10 MG/ML (PF) SYRINGE
PREFILLED_SYRINGE | INTRAVENOUS | Status: AC
  Filled 2022-06-30: qty 10

## 2022-06-30 MED ORDER — SUGAMMADEX SODIUM 200 MG/2ML IV SOLN
INTRAVENOUS | Status: DC | PRN
  Administered 2022-06-30: 200 mg via INTRAVENOUS

## 2022-06-30 MED ORDER — ORAL CARE MOUTH RINSE
15.0000 mL | Freq: Once | OROMUCOSAL | Status: AC
Start: 2022-06-30 — End: 2022-06-30

## 2022-06-30 MED ORDER — DEXAMETHASONE SODIUM PHOSPHATE 10 MG/ML IJ SOLN
INTRAMUSCULAR | Status: DC | PRN
  Administered 2022-06-30: 10 mg via INTRAVENOUS

## 2022-06-30 MED ORDER — FENTANYL CITRATE (PF) 100 MCG/2ML IJ SOLN: 25.0000 ug | INTRAMUSCULAR | Status: DC | PRN

## 2022-06-30 SURGICAL SUPPLY — 33 items
BAG COUNTER SPONGE SURGICOUNT (BAG) ×2 IMPLANT
BAG SPNG CNTER NS LX DISP (BAG) ×1
BALLN PULM 15 16.5 18X75 (BALLOONS) ×1
BALLOON PULM 15 16.5 18X75 (BALLOONS) IMPLANT
BNDG EYE OVAL (GAUZE/BANDAGES/DRESSINGS) ×4 IMPLANT
CANISTER SUCT 3000ML PPV (MISCELLANEOUS) ×2 IMPLANT
COVER BACK TABLE 60X90IN (DRAPES) ×2 IMPLANT
COVER MAYO STAND STRL (DRAPES) ×2 IMPLANT
DRAPE HALF SHEET 40X57 (DRAPES) ×2 IMPLANT
GAUZE 4X4 16PLY ~~LOC~~+RFID DBL (SPONGE) ×2 IMPLANT
GAUZE SPONGE 4X4 12PLY STRL (GAUZE/BANDAGES/DRESSINGS) IMPLANT
GLOVE BIO SURGEON STRL SZ7.5 (GLOVE) ×2 IMPLANT
GOWN STRL REUS W/ TWL LRG LVL3 (GOWN DISPOSABLE) IMPLANT
GOWN STRL REUS W/TWL LRG LVL3 (GOWN DISPOSABLE)
GUARD TEETH (MISCELLANEOUS) IMPLANT
KIT BASIN OR (CUSTOM PROCEDURE TRAY) ×2 IMPLANT
KIT TURNOVER KIT B (KITS) ×2 IMPLANT
NDL HYPO 25GX1X1/2 BEV (NEEDLE) IMPLANT
NDL TRANS ORAL INJECTION (NEEDLE) ×2 IMPLANT
NEEDLE HYPO 25GX1X1/2 BEV (NEEDLE) IMPLANT
NEEDLE TRANS ORAL INJECTION (NEEDLE) IMPLANT
NS IRRIG 1000ML POUR BTL (IV SOLUTION) ×2 IMPLANT
PAD ARMBOARD 7.5X6 YLW CONV (MISCELLANEOUS) ×4 IMPLANT
PATTIES SURGICAL .5 X1 (DISPOSABLE) IMPLANT
PATTIES SURGICAL .5 X3 (DISPOSABLE) IMPLANT
POSITIONER HEAD DONUT 9IN (MISCELLANEOUS) IMPLANT
SOL ANTI FOG 6CC (MISCELLANEOUS) IMPLANT
SOLUTION ANTI FOG 6CC (MISCELLANEOUS) ×1
SUT SILK 2 0 SH (SUTURE) IMPLANT
SYR GAUGE ASSEMBLY ALLIANCE II (MISCELLANEOUS) IMPLANT
SYR INFLATE BILIARY GAUGE (MISCELLANEOUS) ×2 IMPLANT
TOWEL GREEN STERILE FF (TOWEL DISPOSABLE) ×2 IMPLANT
TUBE CONNECTING 12X1/4 (SUCTIONS) ×2 IMPLANT

## 2022-06-30 NOTE — Anesthesia Procedure Notes (Signed)
Date/Time: 06/30/2022 7:20 AM  Performed by: Lucinda Dell, CRNAPre-anesthesia Checklist: Patient identified, Emergency Drugs available, Suction available and Patient being monitored Patient Re-evaluated:Patient Re-evaluated prior to induction Oxygen Delivery Method: Circle system utilized Preoxygenation: Pre-oxygenation with 100% oxygen Induction Type: IV induction Ventilation: Mask ventilation without difficulty Placement Confirmation: positive ETCO2 Dental Injury: Teeth and Oropharynx as per pre-operative assessment  Comments: Jet ventilation case. Positive BMV.  Airway turned over to Dr. Jenne Pane and ventilation with jet throughout procedure.

## 2022-06-30 NOTE — Anesthesia Postprocedure Evaluation (Signed)
Anesthesia Post Note  Patient: JACOYA BAUMAN  Procedure(s) Performed: MICROLARYNGOSCOPY WITH CO2 , BALLOON DILATION AND MITOMYCIN C APPLICATION (Throat)     Patient location during evaluation: PACU Anesthesia Type: General Level of consciousness: awake and alert, patient cooperative and oriented Pain management: pain level controlled Vital Signs Assessment: post-procedure vital signs reviewed and stable Respiratory status: spontaneous breathing, nonlabored ventilation and respiratory function stable Cardiovascular status: blood pressure returned to baseline and stable Postop Assessment: no apparent nausea or vomiting and able to ambulate Anesthetic complications: no   No notable events documented.  Last Vitals:  Vitals:   06/30/22 0845 06/30/22 0900  BP: (!) 113/57   Pulse: 92   Resp: 17   Temp:  36.8 C  SpO2: 95%     Last Pain:  Vitals:   06/30/22 0900  TempSrc:   PainSc: 0-No pain                 Eron Staat,E. Mychael Soots

## 2022-06-30 NOTE — Transfer of Care (Signed)
Immediate Anesthesia Transfer of Care Note  Patient: Karen Wolfe  Procedure(s) Performed: MICROLARYNGOSCOPY WITH CO2 , BALLOON DILATION AND MITOMYCIN C APPLICATION (Throat)  Patient Location: PACU  Anesthesia Type:General  Level of Consciousness: awake, alert , oriented and patient cooperative  Airway & Oxygen Therapy: Patient Spontanous Breathing and Patient connected to face mask oxygen  Post-op Assessment: Report given to RN and Post -op Vital signs reviewed and stable  Post vital signs: Reviewed and stable  Last Vitals:  Vitals Value Taken Time  BP 111/56 06/30/22 0830  Temp 37.2 C 06/30/22 0830  Pulse 100 06/30/22 0832  Resp 21 06/30/22 0832  SpO2 100 % 06/30/22 0832  Vitals shown include unvalidated device data.  Last Pain:  Vitals:   06/30/22 0830  TempSrc:   PainSc: 0-No pain         Complications: No notable events documented.

## 2022-06-30 NOTE — Op Note (Signed)
PREOPERATIVE DIAGNOSIS:  Subglottic stenosis   POSTOPERATIVE DIAGNOSIS:  Subglottic stenosis   PROCEDURE:  Suspended microdirect laryngoscopy with CO2 laser dilation and mitomycin C application   SURGEON:  Melida Quitter, MD   ANESTHESIA:  General with jet ventilation by anesthesia.   COMPLICATIONS:  None.   INDICATIONS:  The patient is a 46 year old female with a history of subglottic stenosis requiring serial dilations, the last being in 01/2021.  She has had recent worsening of restricted breathing and presents to the operating room for surgical management.   FINDINGS:  Subglottic narrowing about 40%.   DESCRIPTION OF PROCEDURE:  The patient was identified in the holding room, informed consent having been obtained including discussion of risks, benefits and alternatives, the patient was brought to the operative suite and put the operative table in  supine position.  Anesthesia was induced and the patient was maintained via mask ventilation.  The eyes were taped closed and bed was turned 90 degrees from anesthesia.  The patient was given intravenous steroids during the  case.  A tooth guard was placed over the upper teeth and a Stortz laryngoscope was placed into the supraglottic position and suspended to Mayo stand using the Lewy arm.  Jet ventilation was initiated.  Damp eye pads were taped over the eyes and a damp towel was placed over the face.  Photodocumentation was performed with the zero degree telescope.  An epinephrine-soaked pledget was held against the stenotic area while holding ventilation.  The operating microscope was brought into the field and was used to evaluate the larynx.  The stenosis was then incised with the CO2 laser on a setting of 8 watts at 12, 5, and 7 o'clock.  The area was then dilated with the largest tracheal balloon to a pressure of 7 atm twice.  Photodocumentation was repeated.  Mitomycin C (0.4 mg/ml, 0.5 ml) was then soaked into a pledget sutured around a segment  of suction tubing and this apparatus was then placed in the subglottic airway for 3 minutes while jet ventilating.  The apparatus was removed.  The larynx was suctioned and sprayed with topical lidocaine.  The laryngoscope was then taking out of suspension and removed from the patient's mouth while suctioning the airway.  The tooth guard was removed and the patient was turned back to anesthesia for wakeup and taken to the recovery room in stable condition.

## 2022-06-30 NOTE — Brief Op Note (Signed)
06/30/2022  8:16 AM  PATIENT:  Karen Wolfe  46 y.o. female  PRE-OPERATIVE DIAGNOSIS:  Subglottic stenosis  POST-OPERATIVE DIAGNOSIS:  Subglottic stenosis  PROCEDURE:  Procedure(s): MICROLARYNGOSCOPY WITH CO2 , BALLOON DILATION AND MITOMYCIN C APPLICATION (N/A)  SURGEON:  Surgeon(s) and Role:    * Christia Reading, MD - Primary  PHYSICIAN ASSISTANT:   ASSISTANTS: none   ANESTHESIA:   general  EBL:  Minimal   BLOOD ADMINISTERED:none  DRAINS: none   LOCAL MEDICATIONS USED:  NONE  SPECIMEN:  No Specimen  DISPOSITION OF SPECIMEN:  N/A  COUNTS:  YES  TOURNIQUET:  * No tourniquets in log *  DICTATION: .Note written in EPIC  PLAN OF CARE: Discharge to home after PACU  PATIENT DISPOSITION:  PACU - hemodynamically stable.   Delay start of Pharmacological VTE agent (>24hrs) due to surgical blood loss or risk of bleeding: no

## 2022-06-30 NOTE — H&P (Signed)
Karen Wolfe is an 46 y.o. female.   Chief Complaint: Subglottic stenosis HPI: 46 year old female with subglottic stenosis who has required serial dilations, the last being in March 2022.  She has had recurrence of restrictive breathing in recent months.  Past Medical History:  Diagnosis Date   Anemia    during pregnancy only   Anxiety    Asthma    COVID    2021 - mild symptoms   Depression    GERD (gastroesophageal reflux disease)    ? silent reflux   History of kidney stones    Paralysis (HCC)    Right Vocal Cord   Pneumonia    " walking PNA"   PONV (postoperative nausea and vomiting)    Shortness of breath dyspnea    with exertion- upper airway stricture   Sleep apnea    wears CPAP   Subglottic stenosis    Vaginal delivery    epidural x2     Past Surgical History:  Procedure Laterality Date   BALLOON DILATION  01/05/2021   Procedure: BALLOON DILATION;  Surgeon: Christia Reading, MD;  Location: [facility] OR;  Service: ENT;;   CO2 LASER APPLICATION  01/05/2021   Procedure: CO2 LASER APPLICATION;  Surgeon: Christia Reading, MD;  Location: [facility] OR;  Service: ENT;;   DILATATION & CURETTAGE/HYSTEROSCOPY WITH MYOSURE N/A 11/22/2016   Procedure: DILATATION & CURETTAGE/HYSTEROSCOPY WITH MYOSURE;  Surgeon: Maxie Better, MD;  Location: [facility];  Service: Gynecology;  Laterality: N/A;   MICROLARYNGOSCOPY  01/05/2021   Procedure: MICROLARYNGOSCOPY;  Surgeon: Christia Reading, MD;  Location: [facility] OR;  Service: ENT;;   MICROLARYNGOSCOPY WITH DILATION N/A 08/09/2015   Procedure: MICROLARYNGOSCOPY WITH DILATION;  Surgeon: Christia Reading, MD;  Location: [facility] OR;  Service: ENT;  Laterality: N/A;  MICRO DIRECT LARYNGOSCOPY WITH CO2 LASER DILATION/JET VENTILATION   MICROLARYNGOSCOPY WITH DILATION N/A 11/18/2018   Procedure: MICROLARYNGOSCOPY WITH CO2 DILATION, MITOMYCIN c AND JET VENTILATION;  Surgeon: Christia Reading, MD;  Location: [facility] OR;  Service: ENT;  Laterality: N/A;   MICROLARYNGOSCOPY WITH DILATION N/A  09/30/2019   Procedure: MICROLARYNGOSCOPY WITH CO2 LASER BALLOON DILATION;  Surgeon: Christia Reading, MD;  Location: [facility] OR;  Service: ENT;  Laterality: N/A;   MITOMYCIN C INJECTION  01/05/2021   Procedure: MITOMYCIN C INJECTION, PROLARYN INECTION;  Surgeon: Christia Reading, MD;  Location: [facility] OR;  Service: ENT;;   VARICOSE VEIN SURGERY     WISDOM TOOTH EXTRACTION      Family History  Problem Relation Age of Onset   Diabetes Mother    Hypertension Mother    Luiz Blare' disease Mother    Diabetes Father    Hypertension Father    Social History:  reports that she has never smoked. She has never used smokeless tobacco. She reports that she does not drink alcohol and does not use drugs.  Allergies:  Allergies  Allergen Reactions   Mold Extract [Trichophyton] Shortness Of Breath   Shellfish Allergy Anaphylaxis, Shortness Of Breath and Nausea And Vomiting   Fentanyl Nausea And Vomiting    Medications Prior to Admission  Medication Sig Dispense Refill   acetaminophen (TYLENOL) 500 MG tablet Take 500-1,000 mg by mouth every 6 (six) hours as needed (pain.).     norethindrone (MICRONOR) 0.35 MG tablet Take 1 tablet by mouth in the morning.     omeprazole (PRILOSEC) 40 MG capsule Take 40 mg by mouth daily before breakfast.     sertraline (ZOLOFT) 25 MG tablet Take 25 mg by mouth  See admin instructions. Take 1 tablet (25 mg) by mouth in addition to the 50 mg tablet during menstruation.     sertraline (ZOLOFT) 50 MG tablet Take 50 mg by mouth daily.     ondansetron (ZOFRAN-ODT) 4 MG disintegrating tablet Take 4 mg by mouth every 8 (eight) hours as needed for nausea.      Results for orders placed or performed during the hospital encounter of 06/30/22 (from the past 48 hour(s))  CBC per protocol     Status: Abnormal   Collection Time: 06/30/22  5:53 AM  Result Value Ref Range   WBC 10.7 (H) 4.0 - 10.5 K/uL   RBC 3.87 3.87 - 5.11 MIL/uL   Hemoglobin 9.9 (L) 12.0 - 15.0 g/dL   HCT 02.5 (L) 42.7 - 06.2  %   MCV 80.9 80.0 - 100.0 fL   MCH 25.6 (L) 26.0 - 34.0 pg   MCHC 31.6 30.0 - 36.0 g/dL   RDW 37.6 28.3 - 15.1 %   Platelets 353 150 - 400 K/uL   nRBC 0.0 0.0 - 0.2 %    Comment: Performed at [facility], [address], [place], [place] [address]  Pregnancy, urine POC     Status: None   Collection Time: 06/30/22  6:20 AM  Result Value Ref Range   Preg Test, Ur NEGATIVE NEGATIVE    Comment:        THE SENSITIVITY OF THIS METHODOLOGY IS >24 mIU/mL    No results found.  Review of Systems  All other systems reviewed and are negative.   Blood pressure (!) 142/63, pulse 72, temperature 97.9 F (36.6 C), temperature source Oral, resp. rate 18, height 5\' 3"  (1.6 m), weight 93.9 kg, last menstrual period 06/29/2022, SpO2 97 %. Physical Exam Constitutional:      Appearance: Normal appearance. She is normal weight.  HENT:     Head: Normocephalic and atraumatic.     Right Ear: External ear normal.     Left Ear: External ear normal.     Nose: Nose normal.     Mouth/Throat:     Mouth: Mucous membranes are moist.     Pharynx: Oropharynx is clear.  Eyes:     Extraocular Movements: Extraocular movements intact.     Conjunctiva/sclera: Conjunctivae normal.     Pupils: Pupils are equal, round, and reactive to light.  Cardiovascular:     Rate and Rhythm: Normal rate.  Pulmonary:     Effort: Pulmonary effort is normal.  Musculoskeletal:     Cervical back: Normal range of motion.  Skin:    General: Skin is warm and dry.  Neurological:     General: No focal deficit present.     Mental Status: She is alert and oriented to person, place, and time.  Psychiatric:        Mood and Affect: Mood normal.        Behavior: Behavior normal.        Thought Content: Thought content normal.        Judgment: Judgment normal.      Assessment/Plan Subglottic stenosis  To OR for microlaryngoscopy with CO2 laser dilation with mitomycin C application.  07/01/2022, MD 06/30/2022, 6:57  AM

## 2022-07-01 ENCOUNTER — Encounter (HOSPITAL_COMMUNITY): Payer: Self-pay | Admitting: Otolaryngology

## 2023-11-14 ENCOUNTER — Other Ambulatory Visit: Payer: Self-pay | Admitting: Gastroenterology

## 2023-12-28 ENCOUNTER — Encounter (HOSPITAL_COMMUNITY): Payer: Self-pay | Admitting: Gastroenterology

## 2023-12-28 NOTE — Progress Notes (Signed)
Pre op call eval Name:  Karen Wolfe  [facility] Cardiologist-n/a ENT- Dr Jenne Pane  EKG-2016 Echo-n/a Stress Test-n/a Cath-n/a Blood thinner- n/a GLP-1-n/a   Hx: Asthma, vocal cord paralysis, subglottic stenosis,OSA, GERD. Patient gets subglottic serial dilations last one being 06/30/22. Did see ENT 11/28/23 and did a test there that showed about 20-30% closed and shes not having major symptoms so decided to hold off on dilation currently. She mentioned they dont usually do a dilation until 80-90% closed. She endorses no issues before with anesthesia, no new cardiac or breathing issues.  Anesthesia Review: Yes

## 2023-12-28 NOTE — Anesthesia Preprocedure Evaluation (Addendum)
 Anesthesia Evaluation  Patient identified by MRN, date of birth, ID band Patient awake    Reviewed: Allergy & Precautions, NPO status , Patient's Chart, lab work & pertinent test results  History of Anesthesia Complications (+) PONV and history of anesthetic complications  Airway Mallampati: III  TM Distance: >3 FB Neck ROM: Full    Dental   Pulmonary asthma , sleep apnea    Pulmonary exam normal breath sounds clear to auscultation       Cardiovascular negative cardio ROS  Rhythm:Regular Rate:Normal     Neuro/Psych  PSYCHIATRIC DISORDERS Anxiety Depression    negative neurological ROS     GI/Hepatic Neg liver ROS, Bowel prep,GERD  Medicated,,  Endo/Other  negative endocrine ROS    Renal/GU negative Renal ROS     Musculoskeletal   Abdominal  (+) + obese  Peds  Hematology  (+) Blood dyscrasia, anemia   Anesthesia Other Findings Right vocal cord paralysis, subglottic stenosis  Reproductive/Obstetrics                             Anesthesia Physical Anesthesia Plan  ASA: 3  Anesthesia Plan: MAC   Post-op Pain Management:    Induction: Intravenous  PONV Risk Score and Plan: 3 and Propofol infusion, TIVA and Treatment may vary due to age or medical condition  Airway Management Planned: Natural Airway and Simple Face Mask  Additional Equipment:   Intra-op Plan:   Post-operative Plan:   Informed Consent: I have reviewed the patients History and Physical, chart, labs and discussed the procedure including the risks, benefits and alternatives for the proposed anesthesia with the patient or authorized representative who has indicated his/her understanding and acceptance.       Plan Discussed with: CRNA and Anesthesiologist  Anesthesia Plan Comments: (Reviewed. Pt with hx of subglottic stenosis s/p multiple dilation procedures by ENT. Last seen by ENT on 11/28/23. No dilation  recommended at this time.  Discussed with patient risks of MAC including, but not limited to, minor pain or discomfort, hearing people in the room, and possible need for backup general anesthesia. Risks for general anesthesia also discussed including, but not limited to, sore throat, hoarse voice, chipped/damaged teeth, injury to vocal cords, nausea and vomiting, allergic reactions, lung infection, heart attack, stroke, and death. All questions answered. )        Anesthesia Quick Evaluation

## 2024-01-04 ENCOUNTER — Encounter (HOSPITAL_COMMUNITY): Payer: Self-pay | Admitting: Gastroenterology

## 2024-01-04 ENCOUNTER — Ambulatory Visit (HOSPITAL_COMMUNITY): Payer: BC Managed Care – PPO | Admitting: Medical

## 2024-01-04 ENCOUNTER — Encounter (HOSPITAL_COMMUNITY)
Admission: RE | Disposition: A | Discharge status: Home / Self Care | Payer: Self-pay | Source: Home / Self Care | Attending: Gastroenterology

## 2024-01-04 ENCOUNTER — Ambulatory Visit (HOSPITAL_COMMUNITY)
Admission: RE | Admit: 2024-01-04 | Discharge: 2024-01-04 | Disposition: A | Discharge status: Home / Self Care | Payer: BC Managed Care – PPO | Attending: Gastroenterology | Admitting: Gastroenterology

## 2024-01-04 ENCOUNTER — Other Ambulatory Visit: Payer: Self-pay

## 2024-01-04 DIAGNOSIS — D649 Anemia, unspecified: Secondary | ICD-10-CM | POA: Insufficient documentation

## 2024-01-04 DIAGNOSIS — F419 Anxiety disorder, unspecified: Secondary | ICD-10-CM | POA: Diagnosis not present

## 2024-01-04 DIAGNOSIS — G4730 Sleep apnea, unspecified: Secondary | ICD-10-CM | POA: Diagnosis not present

## 2024-01-04 DIAGNOSIS — Z6834 Body mass index (BMI) 34.0-34.9, adult: Secondary | ICD-10-CM | POA: Insufficient documentation

## 2024-01-04 DIAGNOSIS — Z1211 Encounter for screening for malignant neoplasm of colon: Secondary | ICD-10-CM | POA: Diagnosis present

## 2024-01-04 DIAGNOSIS — F32A Depression, unspecified: Secondary | ICD-10-CM | POA: Diagnosis not present

## 2024-01-04 DIAGNOSIS — J45909 Unspecified asthma, uncomplicated: Secondary | ICD-10-CM | POA: Insufficient documentation

## 2024-01-04 DIAGNOSIS — K5730 Diverticulosis of large intestine without perforation or abscess without bleeding: Secondary | ICD-10-CM | POA: Diagnosis not present

## 2024-01-04 DIAGNOSIS — K219 Gastro-esophageal reflux disease without esophagitis: Secondary | ICD-10-CM | POA: Insufficient documentation

## 2024-01-04 DIAGNOSIS — Z79899 Other long term (current) drug therapy: Secondary | ICD-10-CM | POA: Diagnosis not present

## 2024-01-04 DIAGNOSIS — E669 Obesity, unspecified: Secondary | ICD-10-CM | POA: Diagnosis not present

## 2024-01-04 HISTORY — PX: COLONOSCOPY WITH PROPOFOL: SHX5780

## 2024-01-04 SURGERY — COLONOSCOPY WITH PROPOFOL
Anesthesia: Monitor Anesthesia Care

## 2024-01-04 MED ORDER — LIDOCAINE 2% (20 MG/ML) 5 ML SYRINGE
INTRAMUSCULAR | Status: DC | PRN
  Administered 2024-01-04: 60 mg via INTRAVENOUS

## 2024-01-04 MED ORDER — PROPOFOL 10 MG/ML IV BOLUS
INTRAVENOUS | Status: DC | PRN
  Administered 2024-01-04: 60 mg via INTRAVENOUS

## 2024-01-04 MED ORDER — SODIUM CHLORIDE 0.9 % IV SOLN: INTRAVENOUS | Status: DC | PRN

## 2024-01-04 MED ORDER — PROPOFOL 500 MG/50ML IV EMUL
INTRAVENOUS | Status: DC | PRN
  Administered 2024-01-04: 125 ug/kg/min via INTRAVENOUS

## 2024-01-04 SURGICAL SUPPLY — 20 items
ELECT REM PT RETURN 9FT ADLT (ELECTROSURGICAL) IMPLANT
ELECTRODE REM PT RTRN 9FT ADLT (ELECTROSURGICAL) IMPLANT
FLOOR PAD 36X40 (MISCELLANEOUS) ×1 IMPLANT
FORCEPS BIOP RAD 4 LRG CAP 4 (CUTTING FORCEPS) IMPLANT
FORCEPS BIOP RJ4 240 W/NDL (CUTTING FORCEPS) IMPLANT
FORCEPS BXJMBJMB 240X2.8X (CUTTING FORCEPS) IMPLANT
INJECTOR/SNARE I SNARE (MISCELLANEOUS) IMPLANT
LUBRICANT JELLY 4.5OZ STERILE (MISCELLANEOUS) IMPLANT
MANIFOLD NEPTUNE II (INSTRUMENTS) IMPLANT
NDL SCLEROTHERAPY 25GX240 (NEEDLE) IMPLANT
NEEDLE SCLEROTHERAPY 25GX240 (NEEDLE) IMPLANT
PAD FLOOR 36X40 (MISCELLANEOUS) ×2 IMPLANT
PROBE APC STR FIRE (PROBE) IMPLANT
PROBE INJECTION GOLD 7FR (MISCELLANEOUS) IMPLANT
SNARE ROTATE MED OVAL 20MM (MISCELLANEOUS) IMPLANT
SYR 50ML LL SCALE MARK (SYRINGE) IMPLANT
TRAP SPECIMEN MUCOUS 40CC (MISCELLANEOUS) IMPLANT
TUBING ENDO SMARTCAP PENTAX (MISCELLANEOUS) IMPLANT
TUBING IRRIGATION ENDOGATOR (MISCELLANEOUS) ×2 IMPLANT
WATER STERILE IRR 1000ML POUR (IV SOLUTION) IMPLANT

## 2024-01-04 NOTE — Transfer of Care (Signed)
 Immediate Anesthesia Transfer of Care Note  Patient: Karen Wolfe  Procedure(s) Performed: COLONOSCOPY WITH PROPOFOL  Patient Location: Endoscopy Unit  Anesthesia Type:MAC  Level of Consciousness: awake, alert , and oriented  Airway & Oxygen Therapy: Patient Spontanous Breathing  Post-op Assessment: Report given to RN and Post -op Vital signs reviewed and stable  Post vital signs: Reviewed and stable  Last Vitals:  Vitals Value Taken Time  BP    Temp    Pulse 79 01/04/24 0752  Resp 14 01/04/24 0752  SpO2 98 % 01/04/24 0752  Vitals shown include unfiled device data.  Last Pain:  Vitals:   01/04/24 0648  TempSrc: Tympanic  PainSc: 0-No pain         Complications: No notable events documented.

## 2024-01-04 NOTE — Op Note (Signed)
 [facility] Patient Name: Akiera Allbaugh Procedure Date: 01/04/2024 MRN: [id number] Attending MD: Jeani Hawking , MD, [id number] Date of Birth: 11/21/1975 CSN: [id number] Age: 48 Admit Type: Outpatient Procedure:                Colonoscopy Indications:              Screening for colorectal malignant neoplasm Providers:                Jeani Hawking, MD, Kandice Robinsons, Technician,                            Marge Duncans, RN Referring MD:              Medicines:                Propofol per Anesthesia Complications:            No immediate complications. Estimated Blood Loss:     Estimated blood loss: none. Procedure:                Pre-Anesthesia Assessment:                           - Prior to the procedure, a History and Physical                            was performed, and patient medications and                            allergies were reviewed. The patient's tolerance of                            previous anesthesia was also reviewed. The risks                            and benefits of the procedure and the sedation                            options and risks were discussed with the patient.                            All questions were answered, and informed consent                            was obtained. Prior Anticoagulants: The patient has                            taken no anticoagulant or antiplatelet agents. ASA                            Grade Assessment: II - A patient with mild systemic                            disease. After reviewing the risks and benefits,  the patient was deemed in satisfactory condition to                            undergo the procedure.                           - Sedation was administered by an anesthesia                            professional. Deep sedation was attained.                           After obtaining informed consent, the colonoscope                            was passed under direct  vision. Throughout the                            procedure, the patient's blood pressure, pulse, and                            oxygen saturations were monitored continuously. The                            CF-HQ190L ([id number] Olympus colonoscope was                            introduced through the anus and advanced to the the                            terminal ileum. The colonoscopy was somewhat                            difficult due to significant looping. Successful                            completion of the procedure was aided by                            straightening and shortening the scope to obtain                            bowel loop reduction. The patient tolerated the                            procedure well. The quality of the bowel                            preparation was evaluated using the BBPS [facility]                            Bowel Preparation Scale) with scores of: Right  Colon = 3, Transverse Colon = 3 and Left Colon = 3                            (entire mucosa seen well with no residual staining,                            small fragments of stool or opaque liquid). The                            total BBPS score equals 9. The terminal ileum,                            ileocecal valve, appendiceal orifice, and rectum                            were photographed. Scope In: 7:35:06 AM Scope Out: 7:46:32 AM Scope Withdrawal Time: 0 hours 7 minutes 2 seconds  Total Procedure Duration: 0 hours 11 minutes 26 seconds  Findings:      Scattered small-mouthed diverticula were found in the sigmoid colon. Impression:               - Diverticulosis in the sigmoid colon.                           - No specimens collected. Moderate Sedation:      Not Applicable - Patient had care per Anesthesia. Recommendation:           - Patient has a contact number available for                            emergencies. The signs and symptoms of potential                             delayed complications were discussed with the                            patient. Return to normal activities tomorrow.                            Written discharge instructions were provided to the                            patient.                           - Resume previous diet.                           - Continue present medications.                           - Repeat colonoscopy in 10 years for screening                            purposes. Procedure Code(s):        ---  Professional ---                           [phone], Colonoscopy, flexible; diagnostic, including                            collection of specimen(s) by brushing or washing,                            when performed (separate procedure) Diagnosis Code(s):        --- Professional ---                           Z12.11, Encounter for screening for malignant                            neoplasm of colon                           K57.30, Diverticulosis of large intestine without                            perforation or abscess without bleeding CPT copyright 2022 American [employer] Association. All rights reserved. The codes documented in this report are preliminary and upon coder review may  be revised to meet current compliance requirements. Jeani Hawking, MD Jeani Hawking, MD 01/04/2024 7:53:59 AM This report has been signed electronically. Number of Addenda: 0

## 2024-01-04 NOTE — Discharge Instructions (Signed)

## 2024-01-04 NOTE — Anesthesia Postprocedure Evaluation (Signed)
 Anesthesia Post Note  Patient: [name]  Procedure(s) Performed: COLONOSCOPY WITH PROPOFOL     Patient location during evaluation: PACU Anesthesia Type: MAC Level of consciousness: awake Pain management: pain level controlled Vital Signs Assessment: post-procedure vital signs reviewed and stable Respiratory status: spontaneous breathing, nonlabored ventilation and respiratory function stable Cardiovascular status: stable and blood pressure returned to baseline Postop Assessment: no apparent nausea or vomiting Anesthetic complications: no   No notable events documented.  Last Vitals:  Vitals:   01/04/24 0800 01/04/24 0808  BP: (!) 116/49 117/74  Pulse: 94 77  Resp: 20 17  Temp:    SpO2: 100% 100%    Last Pain:  Vitals:   01/04/24 0808  TempSrc:   PainSc: 0-No pain                 Linton Rump

## 2024-01-04 NOTE — H&P (Signed)
 Karen Wolfe HPI: At this time the patient denies any problems with nausea, vomiting, fevers, chills, abdominal pain, diarrhea, constipation, hematochezia, melena, GERD, or dysphagia. The patient denies any known family history of colon cancers. No complaints of chest pain, SOB, MI, or sleep apnea.  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  It was initially thought to be asthma, but she did not respond to therapy.  On a 1-1.5 year basis she requires ENT dilation, which resolves her SOB.  Past Medical History:  Diagnosis Date   Anemia    during pregnancy only   Anxiety    Asthma    COVID    2021 - mild symptoms   Depression    GERD (gastroesophageal reflux disease)    ? silent reflux   History of kidney stones    Paralysis (HCC)    Right Vocal Cord   Pneumonia    " walking PNA"   PONV (postoperative nausea and vomiting)    Shortness of breath dyspnea    with exertion- upper airway stricture   Sleep apnea    wears CPAP   Subglottic stenosis    Vaginal delivery    epidural x2     Past Surgical History:  Procedure Laterality Date   BALLOON DILATION  01/05/2021   Procedure: BALLOON DILATION;  Surgeon: Christia Reading, MD;  Location: [facility] OR;  Service: ENT;;   CO2 LASER APPLICATION  01/05/2021   Procedure: CO2 LASER APPLICATION;  Surgeon: Christia Reading, MD;  Location: [facility] OR;  Service: ENT;;   DILATATION & CURETTAGE/HYSTEROSCOPY WITH MYOSURE N/A 11/22/2016   Procedure: DILATATION & CURETTAGE/HYSTEROSCOPY WITH MYOSURE;  Surgeon: Maxie Better, MD;  Location: [facility] ORS;  Service: Gynecology;  Laterality: N/A;   MICROLARYNGOSCOPY  01/05/2021   Procedure: MICROLARYNGOSCOPY;  Surgeon: Christia Reading, MD;  Location: [facility] OR;  Service: ENT;;   MICROLARYNGOSCOPY N/A 06/30/2022   Procedure: MICROLARYNGOSCOPY WITH CO2 , BALLOON DILATION AND MITOMYCIN C APPLICATION;  Surgeon: Christia Reading, MD;  Location: [facility] OR;  Service: ENT;  Laterality: N/A;   MICROLARYNGOSCOPY  WITH DILATION N/A 08/09/2015   Procedure: MICROLARYNGOSCOPY WITH DILATION;  Surgeon: Christia Reading, MD;  Location: [facility] OR;  Service: ENT;  Laterality: N/A;  MICRO DIRECT LARYNGOSCOPY WITH CO2 LASER DILATION/JET VENTILATION   MICROLARYNGOSCOPY WITH DILATION N/A 11/18/2018   Procedure: MICROLARYNGOSCOPY WITH CO2 DILATION, MITOMYCIN c AND JET VENTILATION;  Surgeon: Christia Reading, MD;  Location: [facility] OR;  Service: ENT;  Laterality: N/A;   MICROLARYNGOSCOPY WITH DILATION N/A 09/30/2019   Procedure: MICROLARYNGOSCOPY WITH CO2 LASER BALLOON DILATION;  Surgeon: Christia Reading, MD;  Location: [facility] OR;  Service: ENT;  Laterality: N/A;   MITOMYCIN C INJECTION  01/05/2021   Procedure: MITOMYCIN C INJECTION, PROLARYN INECTION;  Surgeon: Christia Reading, MD;  Location: [facility] OR;  Service: ENT;;   VARICOSE VEIN SURGERY     WISDOM TOOTH EXTRACTION      Family History  Problem Relation Age of Onset   Diabetes Mother    Hypertension Mother    Luiz Blare' disease Mother    Diabetes Father    Hypertension Father     Social History:  reports that she has never smoked. She has never used smokeless tobacco. She reports that she does not drink alcohol and does not use drugs.  Allergies:  Allergies  Allergen Reactions   Mold Extract [Trichophyton] Shortness Of Breath   Shellfish Allergy Anaphylaxis, Shortness Of Breath and Nausea And Vomiting   Fentanyl Nausea And  Vomiting    Medications: Scheduled: Continuous:  No results found for this or any previous visit (from the past 24 hours).   No results found.  ROS:  As stated above in the HPI otherwise negative.  Blood pressure 136/71, pulse 87, temperature 97.8 F (36.6 C), temperature source Tympanic, resp. rate 17, height 5\' 3"  (1.6 m), weight 88.5 kg, SpO2 99%.    PE: Gen: NAD, Alert and Oriented HEENT:  [place]/AT, EOMI Neck: Supple, no LAD Lungs: CTA Bilaterally CV: RRR without M/G/R ABD: Soft, NTND, +BS Ext: No C/C/E  Assessment/Plan: 1) Screening  colonoscopy.  Elain Wixon D 01/04/2024, 7:16 AM

## 2024-01-07 ENCOUNTER — Encounter (HOSPITAL_COMMUNITY): Payer: Self-pay | Admitting: Gastroenterology

## 2024-09-03 ENCOUNTER — Other Ambulatory Visit: Payer: Self-pay | Admitting: Otolaryngology

## 2024-09-09 ENCOUNTER — Encounter (HOSPITAL_COMMUNITY): Payer: Self-pay | Admitting: Otolaryngology

## 2024-09-09 ENCOUNTER — Other Ambulatory Visit: Payer: Self-pay

## 2024-09-09 NOTE — Progress Notes (Signed)
 SDW CALL  Patient was given pre-op instructions over the phone. The opportunity was given for the patient to ask questions. No further questions asked. Patient verbalized understanding of instructions given.   PCP - Samie Frederick, PA-C Cardiologist - denies  PPM/ICD - denies Device Orders -  Rep Notified -   Chest x-ray - na EKG - na Stress Test - denies ECHO - denies Cardiac Cath - denies  Sleep Study - 12/19/16 CPAP - yes  Fasting Blood Sugar - na Checks Blood Sugar _____ times a day  Blood Thinner Instructions:na Aspirin Instructions: na  ERAS Protcol -clears until 0830 PRE-SURGERY Ensure or G2- no  COVID TEST- na   Anesthesia review: no  Patient denies shortness of breath, fever, cough and chest pain over the phone call   Oral Hygiene is also important to reduce your risk of infection.  Remember - BRUSH YOUR TEETH THE MORNING OF SURGERY WITH YOUR REGULAR TOOTHPASTE

## 2024-09-11 ENCOUNTER — Encounter (HOSPITAL_COMMUNITY): Admission: RE | Disposition: A | Payer: Self-pay | Source: Home / Self Care | Attending: Otolaryngology

## 2024-09-11 ENCOUNTER — Ambulatory Visit (HOSPITAL_COMMUNITY)
Admission: RE | Admit: 2024-09-11 | Discharge: 2024-09-11 | Disposition: A | Discharge status: Home / Self Care | Payer: PRIVATE HEALTH INSURANCE | Attending: Otolaryngology | Admitting: Otolaryngology

## 2024-09-11 ENCOUNTER — Encounter (HOSPITAL_COMMUNITY): Payer: Self-pay | Admitting: Otolaryngology

## 2024-09-11 ENCOUNTER — Ambulatory Visit (HOSPITAL_COMMUNITY): Admitting: Anesthesiology

## 2024-09-11 ENCOUNTER — Other Ambulatory Visit: Payer: Self-pay

## 2024-09-11 DIAGNOSIS — J386 Stenosis of larynx: Secondary | ICD-10-CM | POA: Insufficient documentation

## 2024-09-11 DIAGNOSIS — J4489 Other specified chronic obstructive pulmonary disease: Secondary | ICD-10-CM | POA: Diagnosis not present

## 2024-09-11 DIAGNOSIS — G4730 Sleep apnea, unspecified: Secondary | ICD-10-CM | POA: Insufficient documentation

## 2024-09-11 DIAGNOSIS — F418 Other specified anxiety disorders: Secondary | ICD-10-CM | POA: Insufficient documentation

## 2024-09-11 DIAGNOSIS — J3801 Paralysis of vocal cords and larynx, unilateral: Secondary | ICD-10-CM | POA: Diagnosis present

## 2024-09-11 DIAGNOSIS — Z79899 Other long term (current) drug therapy: Secondary | ICD-10-CM | POA: Insufficient documentation

## 2024-09-11 DIAGNOSIS — K219 Gastro-esophageal reflux disease without esophagitis: Secondary | ICD-10-CM | POA: Diagnosis not present

## 2024-09-11 HISTORY — PX: MICROLARYNGOSCOPY W/VOCAL CORD INJECTION: SHX2665

## 2024-09-11 HISTORY — PX: MICROLARYNGOSCOPY WITH LASER: SHX5972

## 2024-09-11 LAB — CBC
HCT: 37.9 % (ref 36.0–46.0)
Hemoglobin: 12.1 g/dL (ref 12.0–15.0)
MCH: 27.4 pg (ref 26.0–34.0)
MCHC: 31.9 g/dL (ref 30.0–36.0)
MCV: 85.7 fL (ref 80.0–100.0)
Platelets: 376 K/uL (ref 150–400)
RBC: 4.42 MIL/uL (ref 3.87–5.11)
RDW: 14.2 % (ref 11.5–15.5)
WBC: 7.8 K/uL (ref 4.0–10.5)
nRBC: 0 % (ref 0.0–0.2)

## 2024-09-11 LAB — POCT PREGNANCY, URINE: Preg Test, Ur: NEGATIVE

## 2024-09-11 SURGERY — MICROLARYNGOSCOPY, WITH PROCEDURE USING LASER
Anesthesia: General | Site: Throat | Laterality: Bilateral

## 2024-09-11 MED ORDER — ONDANSETRON HCL 4 MG/2ML IJ SOLN
INTRAMUSCULAR | Status: DC | PRN
  Administered 2024-09-11: 4 mg via INTRAVENOUS

## 2024-09-11 MED ORDER — GLYCOPYRROLATE 0.2 MG/ML IJ SOLN
INTRAMUSCULAR | Status: DC | PRN
  Administered 2024-09-11: .2 mg via INTRAVENOUS

## 2024-09-11 MED ORDER — FENTANYL CITRATE (PF) 100 MCG/2ML IJ SOLN
INTRAMUSCULAR | Status: AC
  Filled 2024-09-11: qty 2

## 2024-09-11 MED ORDER — MIDAZOLAM HCL (PF) 2 MG/2ML IJ SOLN
INTRAMUSCULAR | Status: DC | PRN
  Administered 2024-09-11: 2 mg via INTRAVENOUS

## 2024-09-11 MED ORDER — LACTATED RINGERS IV SOLN: INTRAVENOUS | Status: DC

## 2024-09-11 MED ORDER — MITOMYCIN 0.2 MG OP KIT: 0.2000 mg | PACK | Freq: Once | OPHTHALMIC | Status: DC

## 2024-09-11 MED ORDER — ACETAMINOPHEN 500 MG PO TABS
1000.0000 mg | ORAL_TABLET | Freq: Once | ORAL | Status: AC
  Administered 2024-09-11: 1000 mg via ORAL
  Filled 2024-09-11: qty 2

## 2024-09-11 MED ORDER — FENTANYL CITRATE (PF) 100 MCG/2ML IJ SOLN
INTRAMUSCULAR | Status: DC | PRN
  Administered 2024-09-11: 50 ug via INTRAVENOUS

## 2024-09-11 MED ORDER — CHLORHEXIDINE GLUCONATE 0.12 % MT SOLN
15.0000 mL | Freq: Once | OROMUCOSAL | Status: AC
  Administered 2024-09-11: 15 mL via OROMUCOSAL
  Filled 2024-09-11: qty 15

## 2024-09-11 MED ORDER — ORAL CARE MOUTH RINSE
15.0000 mL | Freq: Once | OROMUCOSAL | Status: AC
Start: 2024-09-11 — End: 2024-09-11

## 2024-09-11 MED ORDER — PROPOFOL 10 MG/ML IV BOLUS
INTRAVENOUS | Status: AC
Start: 2024-09-11 — End: 2024-09-11
  Filled 2024-09-11: qty 20

## 2024-09-11 MED ORDER — MIDAZOLAM HCL 2 MG/2ML IJ SOLN
INTRAMUSCULAR | Status: AC
  Filled 2024-09-11: qty 2

## 2024-09-11 MED ORDER — 0.9 % SODIUM CHLORIDE (POUR BTL) OPTIME
TOPICAL | Status: DC | PRN
  Administered 2024-09-11: 1000 mL

## 2024-09-11 MED ORDER — SCOPOLAMINE 1 MG/3DAYS TD PT72
1.0000 | MEDICATED_PATCH | TRANSDERMAL | Status: DC
  Administered 2024-09-11: 1 mg via TRANSDERMAL
  Filled 2024-09-11: qty 1

## 2024-09-11 MED ORDER — PROPOFOL 500 MG/50ML IV EMUL
INTRAVENOUS | Status: DC | PRN
  Administered 2024-09-11: 150 ug/kg/min via INTRAVENOUS

## 2024-09-11 MED ORDER — MITOMYCIN-C INJECTION USE IN OR ONLY (0.4 MG/ML)
0.5000 mL | Freq: Once | INTRAVENOUS | Status: AC
  Administered 2024-09-11: .5 mL
  Filled 2024-09-11: qty 0.5

## 2024-09-11 MED ORDER — ROCURONIUM BROMIDE 10 MG/ML (PF) SYRINGE
PREFILLED_SYRINGE | INTRAVENOUS | Status: DC | PRN
  Administered 2024-09-11: 50 mg via INTRAVENOUS

## 2024-09-11 MED ORDER — DEXAMETHASONE SOD PHOSPHATE PF 10 MG/ML IJ SOLN
INTRAMUSCULAR | Status: DC | PRN
  Administered 2024-09-11: 10 mg via INTRAVENOUS

## 2024-09-11 MED ORDER — SUGAMMADEX SODIUM 200 MG/2ML IV SOLN
INTRAVENOUS | Status: DC | PRN
  Administered 2024-09-11: 200 mg via INTRAVENOUS

## 2024-09-11 MED ORDER — LIDOCAINE HCL (CARDIAC) PF 100 MG/5ML IV SOSY
PREFILLED_SYRINGE | INTRAVENOUS | Status: DC | PRN
  Administered 2024-09-11: 20 mg via INTRAVENOUS

## 2024-09-11 MED ORDER — EPINEPHRINE HCL (NASAL) 0.1 % NA SOLN
NASAL | Status: AC
  Filled 2024-09-11: qty 60

## 2024-09-11 MED ORDER — PROPOFOL 10 MG/ML IV BOLUS
INTRAVENOUS | Status: DC | PRN
  Administered 2024-09-11: 150 mg via INTRAVENOUS

## 2024-09-11 MED ORDER — EPINEPHRINE PF 1 MG/ML IJ SOLN
INTRAMUSCULAR | Status: DC | PRN
  Administered 2024-09-11: 1 mL

## 2024-09-11 SURGICAL SUPPLY — 35 items
BAG COUNTER SPONGE SURGICOUNT (BAG) ×2 IMPLANT
BALLN PULMONARY 10-12 (MISCELLANEOUS) IMPLANT
BALLOON PULM 12 13.5 15X75 (BALLOONS) IMPLANT
BALLOON PULM 15 16.5 18X75 (BALLOONS) IMPLANT
BLADE SURG 15 STRL LF DISP TIS (BLADE) IMPLANT
BNDG EYE OVAL 2 1/8 X 2 5/8 (GAUZE/BANDAGES/DRESSINGS) IMPLANT
CANISTER SUCTION 3000ML PPV (SUCTIONS) ×2 IMPLANT
CNTNR URN SCR LID CUP LEK RST (MISCELLANEOUS) IMPLANT
COVER BACK TABLE 60X90IN (DRAPES) ×2 IMPLANT
COVER MAYO STAND STRL (DRAPES) ×2 IMPLANT
DEPRESSOR TONGUE 6 IN STERILE (GAUZE/BANDAGES/DRESSINGS) IMPLANT
DRAPE HALF SHEET 40X57 (DRAPES) ×2 IMPLANT
GAUZE SPONGE 4X4 12PLY STRL (GAUZE/BANDAGES/DRESSINGS) ×2 IMPLANT
GLOVE BIO SURGEON STRL SZ7.5 (GLOVE) ×2 IMPLANT
GOWN STRL REUS W/ TWL LRG LVL3 (GOWN DISPOSABLE) IMPLANT
GUARD TEETH (MISCELLANEOUS) IMPLANT
KIT BASIN OR (CUSTOM PROCEDURE TRAY) ×2 IMPLANT
KIT PROLARN PLUS GEL W/NDL (Prosthesis and Implant ENT) IMPLANT
KIT TURNOVER KIT B (KITS) ×2 IMPLANT
NDL HYPO 25GX1X1/2 BEV (NEEDLE) IMPLANT
NDL TRANS ORAL INJECTION (NEEDLE) IMPLANT
NEEDLE HYPO 25GX1X1/2 BEV (NEEDLE) IMPLANT
NEEDLE TRANS ORAL INJECTION (NEEDLE) IMPLANT
PAD ARMBOARD POSITIONER FOAM (MISCELLANEOUS) ×4 IMPLANT
PATTIES SURGICAL .5 X1 (DISPOSABLE) ×2 IMPLANT
PATTIES SURGICAL .5 X3 (DISPOSABLE) ×2 IMPLANT
POSITIONER HEAD DONUT 9IN (MISCELLANEOUS) IMPLANT
SOLN 0.9% NACL POUR BTL 1000ML (IV SOLUTION) ×2 IMPLANT
SOLN STERILE WATER BTL 1000 ML (IV SOLUTION) ×2 IMPLANT
SOLUTION ANTFG W/FOAM PAD STRL (MISCELLANEOUS) ×2 IMPLANT
SURGILUBE 2OZ TUBE FLIPTOP (MISCELLANEOUS) IMPLANT
SUT SILK 2 0 PERMA HAND 18 BK (SUTURE) IMPLANT
SYR GAUGE ASSEMBLY ALLIANCE II (MISCELLANEOUS) IMPLANT
TOWEL GREEN STERILE FF (TOWEL DISPOSABLE) ×4 IMPLANT
TUBE CONNECTING 12X1/4 (SUCTIONS) ×2 IMPLANT

## 2024-09-11 NOTE — Anesthesia Postprocedure Evaluation (Signed)
 Anesthesia Post Note  Patient: Karen Wolfe  Procedure(s) Performed: MICROLARYNGOSCOPY, WITH PROCEDURE USING  CO2 LASER DILATION (Bilateral: Throat) MICROLARYNGOSCOPY, WITH VOCAL CORD PROLARYN INJECTION (Bilateral: Throat)     Patient location during evaluation: PACU Anesthesia Type: General Level of consciousness: awake and alert, oriented and patient cooperative Pain management: pain level controlled Vital Signs Assessment: post-procedure vital signs reviewed and stable Respiratory status: spontaneous breathing, nonlabored ventilation and respiratory function stable Cardiovascular status: blood pressure returned to baseline and stable Postop Assessment: no apparent nausea or vomiting Anesthetic complications: no   No notable events documented.  Last Vitals:  Vitals:   09/11/24 1330 09/11/24 1345  BP: 114/68 120/62  Pulse: 90 88  Resp: (!) 24 (!) 25  Temp:  36.6 C  SpO2: 95% 95%    Last Pain:  Vitals:   09/11/24 1345  TempSrc:   PainSc: 0-No pain                 Savita Runner,E. Kathren Scearce

## 2024-09-11 NOTE — Anesthesia Preprocedure Evaluation (Addendum)
 Anesthesia Evaluation  Patient identified by MRN, date of birth, ID band Patient awake    Reviewed: Allergy & Precautions, NPO status , Patient's Chart, lab work & pertinent test results  History of Anesthesia Complications (+) PONV  Airway Mallampati: I  TM Distance: >3 FB Neck ROM: Full    Dental  (+) Dental Advisory Given   Pulmonary asthma , sleep apnea and Continuous Positive Airway Pressure Ventilation , COPD,  COPD inhaler R vocal cord paresis   breath sounds clear to auscultation       Cardiovascular negative cardio ROS  Rhythm:Regular Rate:Normal     Neuro/Psych   Anxiety Depression    negative neurological ROS     GI/Hepatic Neg liver ROS,GERD  Medicated and Controlled,,  Endo/Other  BMI 34.5  Renal/GU H/o stones     Musculoskeletal   Abdominal   Peds  Hematology Hb 12.1, plt 376k   Anesthesia Other Findings   Reproductive/Obstetrics                              Anesthesia Physical Anesthesia Plan  ASA: 3  Anesthesia Plan: General   Post-op Pain Management: Tylenol  PO (pre-op)*   Induction: Intravenous  PONV Risk Score and Plan: 4 or greater and Ondansetron , Dexamethasone  and Scopolamine  patch - Pre-op  Airway Management Planned: Oral ETT  Additional Equipment: None  Intra-op Plan:   Post-operative Plan: Extubation in OR  Informed Consent: I have reviewed the patients History and Physical, chart, labs and discussed the procedure including the risks, benefits and alternatives for the proposed anesthesia with the patient or authorized representative who has indicated his/her understanding and acceptance.     Dental advisory given  Plan Discussed with: CRNA and Surgeon  Anesthesia Plan Comments: (Jet ventilation intra-op, TIVA)         Anesthesia Quick Evaluation

## 2024-09-11 NOTE — Op Note (Signed)
  PREOPERATIVE DIAGNOSIS:  Subglottic stenosis and right vocal fold paresis   POSTOPERATIVE DIAGNOSIS:  Subglottic stenosis and right vocal fold paresis   PROCEDURE:  Suspended microdirect laryngoscopy with CO2 laser dilation and right vocal fold Prolaryn injection   SURGEON:  Vaughan Ricker, MD   ANESTHESIA:  General with jet ventilation by anesthesia.   COMPLICATIONS:  None.   INDICATIONS:  The patient is a 48 year old female with a history of idiopathic subglottic stenosis that has required serial dilations, her last being in 06/2022.  She has developed worsening obstructive breathing in the past few months.  She has also been treated for right vocal fold paresis with Prolaryn injection in 01/2021 and has developed some worsening of her hoarseness in recent months.  She presents to the operating room for surgical management.   FINDINGS:  50% circumferential stenosis of subglottis.  0.3 cc of Prolaryn injected in right vocal fold and 0.1 cc in left vocal fold.   DESCRIPTION OF PROCEDURE:  The patient was identified in the holding room, informed consent having been obtained including discussion of risks, benefits and alternatives, the patient was brought to the operative suite and put the operative table in the supine position.  Anesthesia was induced and the patient was maintained via mask ventilation.  The eyes were taped closed and bed was turned 90 degrees from anesthesia.  Damp eye pads were taped over the eyes and a damp towel was placed over the face.  The patient was given intravenous steroids during the case.  A tooth guard was placed over the upper teeth and a Stortz laryngoscope was placed into the glottic position and suspended to Mayo stand using the Lewy arm.  Jet ventilation was initiated.  A zero degree endoscope was used to make a pre-intervention photo of the subglottis.  The CO2 laser was then used on a setting of 8 watts to make radial incisions at 12, 4, and 8 o'clock.  The large  tracheal balloon was inserted and inflated to 7 atm for 30 seconds twice.  A section of pledget sutured around a segment of suction tubing and soaked with mitomycin  (0.4 mg/ml, 0.5 ml) was then placed at the site of dilation and the patient jet ventilated through the segment for 3 minutes.  The segment was removed.  A post-intervention photo was made with the endoscope.  The laryngoscope was then backed to a supraglottic position.  Prolaryn was then injected into the right vocal fold, totaling 0.3 cc with 2/3 in the posterior position and 1/3 in the anterior position.  An additional 0.1 cc was injected then in the left vocal fold similarly.  The larynx was sprayed with topical lidocaine .  The laryngoscope was then taking out of suspension and removed from the patient's mouth while suctioning the airway.  The tooth guard was removed and the patient was turned back to anesthesia for wakeup and taken to the recovery room in stable condition.

## 2024-09-11 NOTE — H&P (Signed)
 Karen Wolfe is an 48 y.o. female.   Chief Complaint: Subglottic stenosis and hoarseness HPI: 48 year old female with subglottic stenosis who has required serial dilation and has had recurrence of obstructive breathing for the past few months.  In addition, she has previously benefited from injection augmentation for hoarseness and has had some recurrence there as well.  Past Medical History:  Diagnosis Date   Anemia    during pregnancy only   Anxiety    Asthma    COVID    2021 - mild symptoms   Depression    GERD (gastroesophageal reflux disease)    ? silent reflux   History of kidney stones    Paralysis (HCC)    Right Vocal Cord   Pneumonia     walking PNA   PONV (postoperative nausea and vomiting)    Shortness of breath dyspnea    with exertion- upper airway stricture   Sleep apnea    wears CPAP   Subglottic stenosis    Vaginal delivery    epidural x2     Past Surgical History:  Procedure Laterality Date   BALLOON DILATION  01/05/2021   Procedure: BALLOON DILATION;  Surgeon: Carlie Clark, MD;  Location: [facility] OR;  Service: ENT;;   CO2 LASER APPLICATION  01/05/2021   Procedure: CO2 LASER APPLICATION;  Surgeon: Carlie Clark, MD;  Location: [facility] OR;  Service: ENT;;   COLONOSCOPY WITH PROPOFOL  N/A 01/04/2024   Procedure: COLONOSCOPY WITH PROPOFOL ;  Surgeon: Rollin Dover, MD;  Location: [facility];  Service: Gastroenterology;  Laterality: N/A;   DILATATION & CURETTAGE/HYSTEROSCOPY WITH MYOSURE N/A 11/22/2016   Procedure: DILATATION & CURETTAGE/HYSTEROSCOPY WITH MYOSURE;  Surgeon: Dickie Carder, MD;  Location: [facility];  Service: Gynecology;  Laterality: N/A;   MICROLARYNGOSCOPY  01/05/2021   Procedure: MICROLARYNGOSCOPY;  Surgeon: Carlie Clark, MD;  Location: [facility] OR;  Service: ENT;;   MICROLARYNGOSCOPY N/A 06/30/2022   Procedure: MICROLARYNGOSCOPY WITH CO2 , BALLOON DILATION AND MITOMYCIN  C APPLICATION;  Surgeon: Carlie Clark, MD;  Location: [facility] OR;  Service: ENT;  Laterality:  N/A;   MICROLARYNGOSCOPY WITH DILATION N/A 08/09/2015   Procedure: MICROLARYNGOSCOPY WITH DILATION;  Surgeon: Clark Carlie, MD;  Location: [facility] OR;  Service: ENT;  Laterality: N/A;  MICRO DIRECT LARYNGOSCOPY WITH CO2 LASER DILATION/JET VENTILATION   MICROLARYNGOSCOPY WITH DILATION N/A 11/18/2018   Procedure: MICROLARYNGOSCOPY WITH CO2 DILATION, MITOMYCIN  c AND JET VENTILATION;  Surgeon: Carlie Clark, MD;  Location: [facility] OR;  Service: ENT;  Laterality: N/A;   MICROLARYNGOSCOPY WITH DILATION N/A 09/30/2019   Procedure: MICROLARYNGOSCOPY WITH CO2 LASER BALLOON DILATION;  Surgeon: Carlie Clark, MD;  Location: [facility] OR;  Service: ENT;  Laterality: N/A;   MITOMYCIN  C INJECTION  01/05/2021   Procedure: MITOMYCIN  C INJECTION, PROLARYN INECTION;  Surgeon: Carlie Clark, MD;  Location: [facility] OR;  Service: ENT;;   VARICOSE VEIN SURGERY     WISDOM TOOTH EXTRACTION      Family History  Problem Relation Age of Onset   Diabetes Mother    Hypertension Mother    Yvone' disease Mother    Diabetes Father    Hypertension Father    Social History:  reports that she has never smoked. She has never used smokeless tobacco. She reports that she does not drink alcohol and does not use drugs.  Allergies:  Allergies  Allergen Reactions   Mold Extract [Trichophyton] Shortness Of Breath   Shellfish Allergy Anaphylaxis, Shortness Of Breath and Nausea And Vomiting   Fentanyl  Nausea And Vomiting  Medications Prior to Admission  Medication Sig Dispense Refill   acetaminophen  (TYLENOL ) 500 MG tablet Take 500-1,000 mg by mouth every 6 (six) hours as needed (pain.).     levalbuterol (XOPENEX HFA) 45 MCG/ACT inhaler Inhale 1-2 puffs into the lungs daily as needed for shortness of breath.     norethindrone (MELEYA) 0.35 MG tablet Take 0.35 mg by mouth daily.     omeprazole (PRILOSEC) 40 MG capsule Take 40 mg by mouth every other day.     sertraline (ZOLOFT) 50 MG tablet Take 50 mg by mouth See admin instructions. Take 50 mg  daily. EXCEPT: The week of her period take 75 mg      Results for orders placed or performed during the hospital encounter of 09/11/24 (from the past 48 hours)  CBC per protocol     Status: None   Collection Time: 09/11/24  9:32 AM  Result Value Ref Range   WBC 7.8 4.0 - 10.5 K/uL   RBC 4.42 3.87 - 5.11 MIL/uL   Hemoglobin 12.1 12.0 - 15.0 g/dL   HCT 62.0 63.9 - 53.9 %   MCV 85.7 80.0 - 100.0 fL   MCH 27.4 26.0 - 34.0 pg   MCHC 31.9 30.0 - 36.0 g/dL   RDW 85.7 88.4 - 84.4 %   Platelets 376 150 - 400 K/uL   nRBC 0.0 0.0 - 0.2 %    Comment: Performed at [facility], [address] [place], [place] [address]  Pregnancy, urine POC     Status: None   Collection Time: 09/11/24  9:59 AM  Result Value Ref Range   Preg Test, Ur NEGATIVE NEGATIVE    Comment:        THE SENSITIVITY OF THIS METHODOLOGY IS >20 mIU/mL.    No results found.  Review of Systems  All other systems reviewed and are negative.   Blood pressure 108/64, pulse 72, temperature 98.8 F (37.1 C), temperature source Oral, resp. rate 17, height 5' 3 (1.6 m), weight 88.5 kg, last menstrual period 07/25/2024, SpO2 98%. Physical Exam Constitutional:      Appearance: Normal appearance. She is normal weight.  HENT:     Head: Normocephalic and atraumatic.     Right Ear: External ear normal.     Left Ear: External ear normal.     Nose: Nose normal.     Mouth/Throat:     Mouth: Mucous membranes are moist.     Pharynx: Oropharynx is clear.  Eyes:     Extraocular Movements: Extraocular movements intact.     Pupils: Pupils are equal, round, and reactive to light.  Cardiovascular:     Rate and Rhythm: Normal rate.  Pulmonary:     Effort: Pulmonary effort is normal.  Skin:    General: Skin is warm and dry.  Neurological:     General: No focal deficit present.     Mental Status: She is alert and oriented to person, place, and time.  Psychiatric:        Mood and Affect: Mood normal.        Behavior: Behavior  normal.        Thought Content: Thought content normal.        Judgment: Judgment normal.      Assessment/Plan Subglottic stenosis and hoarseness  To OR for SMDL with CO2 laser dilation and mitomycin  C application and also Prolaryn injection.  Vaughan Ricker, MD 09/11/2024, 11:57 AM

## 2024-09-11 NOTE — Transfer of Care (Signed)
 Immediate Anesthesia Transfer of Care Note  Patient: Karen Wolfe  Procedure(s) Performed: MICROLARYNGOSCOPY, WITH PROCEDURE USING  CO2 LASER DILATION (Bilateral: Throat) MICROLARYNGOSCOPY, WITH VOCAL CORD PROLARYN INJECTION (Bilateral: Throat)  Patient Location: PACU  Anesthesia Type:General  Level of Consciousness: awake, alert , oriented, drowsy, and patient cooperative  Airway & Oxygen Therapy: Patient Spontanous Breathing and Patient connected to face mask oxygen  Post-op Assessment: Report given to RN and Post -op Vital signs reviewed and stable  Post vital signs: Reviewed and stable  Last Vitals:  Vitals Value Taken Time  BP 116/89 09/11/24 12:53  Temp    Pulse 111 09/11/24 12:59  Resp 19 09/11/24 12:57  SpO2 98 % 09/11/24 12:59  Vitals shown include unfiled device data.  Last Pain:  Vitals:   09/11/24 0951  TempSrc:   PainSc: 0-No pain      Patients Stated Pain Goal: 0 (09/11/24 0942)  Complications: No notable events documented.

## 2024-09-12 ENCOUNTER — Encounter (HOSPITAL_COMMUNITY): Payer: Self-pay | Admitting: Otolaryngology
